# Patient Record
Sex: Male | Born: 1935 | Race: White | Hispanic: No | Smoking: Former smoker
Health system: Southern US, Community
[De-identification: ages and names within clinical notes are randomized; demographics above are authoritative.]

## PROBLEM LIST (undated history)

## (undated) DIAGNOSIS — I4891 Unspecified atrial fibrillation: Secondary | ICD-10-CM

## (undated) DIAGNOSIS — F0390 Unspecified dementia without behavioral disturbance: Secondary | ICD-10-CM

## (undated) DIAGNOSIS — R0602 Shortness of breath: Secondary | ICD-10-CM

## (undated) DIAGNOSIS — K219 Gastro-esophageal reflux disease without esophagitis: Secondary | ICD-10-CM

## (undated) DIAGNOSIS — J189 Pneumonia, unspecified organism: Secondary | ICD-10-CM

## (undated) DIAGNOSIS — I10 Essential (primary) hypertension: Secondary | ICD-10-CM

## (undated) HISTORY — PX: CATARACT EXTRACTION: SUR2

---

## 2011-06-13 ENCOUNTER — Ambulatory Visit (INDEPENDENT_AMBULATORY_CARE_PROVIDER_SITE_OTHER): Payer: Medicare Other | Admitting: Family Medicine

## 2011-06-13 DIAGNOSIS — R2681 Unsteadiness on feet: Secondary | ICD-10-CM | POA: Insufficient documentation

## 2011-06-13 DIAGNOSIS — I739 Peripheral vascular disease, unspecified: Secondary | ICD-10-CM | POA: Insufficient documentation

## 2011-06-13 DIAGNOSIS — R531 Weakness: Secondary | ICD-10-CM

## 2011-06-13 DIAGNOSIS — R251 Tremor, unspecified: Secondary | ICD-10-CM | POA: Insufficient documentation

## 2011-06-13 DIAGNOSIS — I10 Essential (primary) hypertension: Secondary | ICD-10-CM

## 2011-06-13 DIAGNOSIS — R32 Unspecified urinary incontinence: Secondary | ICD-10-CM

## 2011-06-13 DIAGNOSIS — R259 Unspecified abnormal involuntary movements: Secondary | ICD-10-CM

## 2011-06-13 DIAGNOSIS — R269 Unspecified abnormalities of gait and mobility: Secondary | ICD-10-CM

## 2011-06-13 LAB — POCT UA - MICROSCOPIC ONLY
Amorphous: POSITIVE
Bacteria, U Microscopic: NEGATIVE
Casts, Ur, LPF, POC: NEGATIVE
Crystals, Ur, HPF, POC: POSITIVE
Mucus, UA: POSITIVE
Yeast, UA: NEGATIVE

## 2011-06-13 LAB — POCT URINALYSIS DIPSTICK
Bilirubin, UA: NEGATIVE
Blood, UA: NEGATIVE
Glucose, UA: NEGATIVE
Ketones, UA: NEGATIVE
Leukocytes, UA: NEGATIVE
Nitrite, UA: NEGATIVE
Protein, UA: 30
Spec Grav, UA: 1.02
Urobilinogen, UA: 1
pH, UA: 7

## 2011-06-13 LAB — COMPREHENSIVE METABOLIC PANEL
ALT: 13 U/L (ref 0–53)
AST: 19 U/L (ref 0–37)
Albumin: 4.4 g/dL (ref 3.5–5.2)
Alkaline Phosphatase: 50 U/L (ref 39–117)
BUN: 22 mg/dL (ref 6–23)
CO2: 28 mEq/L (ref 19–32)
Calcium: 9.7 mg/dL (ref 8.4–10.5)
Chloride: 101 mEq/L (ref 96–112)
Creat: 1.09 mg/dL (ref 0.50–1.35)
Glucose, Bld: 99 mg/dL (ref 70–99)
Potassium: 4.1 mEq/L (ref 3.5–5.3)
Sodium: 136 mEq/L (ref 135–145)
Total Bilirubin: 0.6 mg/dL (ref 0.3–1.2)
Total Protein: 7 g/dL (ref 6.0–8.3)

## 2011-06-13 LAB — TSH: TSH: 0.892 u[IU]/mL (ref 0.350–4.500)

## 2011-06-13 MED ORDER — AMLODIPINE BESYLATE 5 MG PO TABS: 5.0000 mg | ORAL_TABLET | Freq: Every day | ORAL | Status: DC

## 2011-06-13 NOTE — Progress Notes (Signed)
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 He lives alone but is on oxygen on him frequently. His son, being a retired Emergency planning/management officer, does have the ability check his father every day.  Over the last year Mr. Supple has started to talk "crazy", mumbling at times. He is also quite dizzy when he stands and he's had several falls  Objective this elderly man is in no acute distress, mumbles almost unintelligibly. His son is at his side today stating in his wheelchair. When I tried to get Mr. Dunne to stand. It's obvious that he has a gross tremor, and he is unable to really stand straight. He takes quite a bit of effort to get him out of a chair.  HEENT: Unremarkable  Neck: No bruits, no thyromegaly, supple  Chest: Clear to auscultation  Heart: Regular, no murmur, no gallop or rub.  I helped Mr. Tillman up and inspected him for bruises, lacerations or other signs of, of which there are none.  Extremities: 2+ left pedal edema, left foot is cold but has reasonable color, capillary filling is about 10 seconds. Right foot appears normal.  Legs are nontender without significant edema or ecchymosis.  Results for orders placed in visit on 06/13/11  POCT UA - MICROSCOPIC ONLY      Component Value Range   WBC, Ur, HPF, POC 0-5     RBC, urine, microscopic 2-10     Bacteria, U Microscopic negative     Mucus, UA positive     Epithelial cells, urine per micros 0-5     Crystals, Ur, HPF, POC positive     Casts, Ur, LPF, POC negative     Yeast, UA negative     Amorphous positive    POCT URINALYSIS DIPSTICK      Component Value Range   Color, UA yellow     Clarity, UA clear     Glucose, UA negative     Bilirubin, UA negative     Ketones, UA negative     Spec Grav, UA 1.020     Blood, UA negative     pH, UA 7.0     Protein, UA 30     Urobilinogen, UA 1.0     Nitrite, UA negative     Leukocytes, UA Negative       Assessment: Clearly we are  facing some end-of-life issues. He subjective nursing home care with the son and he is committed to keeping his father at home as long as possible. He plans on moving in with him the next day or so.  Plan: Refer to neurology  Check other labs referable to unsteady gait  Encourage son to consider long-term issues such as nursing home and advanced directives  Treat hypertension with amlodipine 5 mg daily

## 2011-06-13 NOTE — Patient Instructions (Signed)
I am quite concerned with the debilitated condition of Ronald Prince.  He will be needing more and more assistance to maintain his activities of daily living:  Feeding self, clothing self, bathing and toileting.  I think serious consideration should be given to nursing home placement in the near future.  I am happy to provide medical care and evaluation as the needs arise.  I am running several tests to see if there is any reversible cause of the weakness and unsteady gait, as well as the urinary incontinence.  I am referring Ronald Prince to neurology.    Advance Directives (My Voice, My Choice) Advance directives are a means for you to make choices about your health care. It is a way that you may accept or refuse medical treatment if you cannot speak for yourself. An advance directive gives you a way to express your wishes about treatment choices in the event that you cannot speak for yourself. These directives protect your right to make your own health care choices. Some examples of advance directives would be:  A living will is a prepared document that designates your wishes in the event of a serious illness when you cannot care for yourself.   A patient advocate designation for health care means you choose someone who knows your wishes and can speak for you, on your behalf, should you not be able to do so yourself. This is often a close friend or family member.   Think about what is important for you in your life. To what extent do you want machines to keep you alive? How much pain are you willing to accept?   Decide what types of life-sustaining treatments you would or would not want.   Name a person to be your advocate who understands all your wishes and is willing and able to carry them out.   A durable power of attorney for health care is a formal legal agreement with an attorney or legal representative who will be bound to carry out your wishes in the event you are unable to care for or represent  yourself. This should be someone you trust to make important medical decisions for you.   Do Not Resuscitate (DNR) is a request to do nothing in the event that your heart stops. A DNR order is used if you are very ill and not expected to recover. DNR orders are accepted by nearly all caregivers and hospitals.  Most caregiver's offices and hospitals have advance directive forms you can use. You may cancel or change these documents at any time. You must be mentally sound and able to communicate your wishes at the time you fill out these forms. Regardless of how you let your final wishes be known in the event of a terminal illness, make sure you discuss them with your family and friends. Copies should be given to your caregiver, your hospital, your advocate or attorney, and to significant others. If you travel, you may want to find out what is legal and binding in the states where you will be. Laws vary from state to state. Document Released: 03/21/2001 Document Revised: 12/30/2010 Document Reviewed: 09/18/2007 [facility] Patient Information 2012 [place], [place].

## 2011-06-14 LAB — CBC
HCT: 35 % — ABNORMAL LOW (ref 39.0–52.0)
Hemoglobin: 12.1 g/dL — ABNORMAL LOW (ref 13.0–17.0)
MCH: 33.1 pg (ref 26.0–34.0)
MCHC: 34.6 g/dL (ref 30.0–36.0)
MCV: 95.6 fL (ref 78.0–100.0)
Platelets: 298 10*3/uL (ref 150–400)
RBC: 3.66 MIL/uL — ABNORMAL LOW (ref 4.22–5.81)
RDW: 13.2 % (ref 11.5–15.5)
WBC: 7.6 10*3/uL (ref 4.0–10.5)

## 2011-06-15 ENCOUNTER — Telehealth: Payer: Self-pay

## 2011-06-15 LAB — URINE CULTURE: Colony Count: 15000

## 2011-06-15 NOTE — Telephone Encounter (Signed)
Pt's son CB about pt's labs, gave him Dr Cain Saupe message - see notes on lab results. Son agreed to have pt f/up on possible prostatitis in 2 wks. Transferred to 104 to try to sch appt.

## 2011-08-04 ENCOUNTER — Encounter: Payer: Self-pay | Admitting: Family Medicine

## 2011-08-04 ENCOUNTER — Ambulatory Visit (INDEPENDENT_AMBULATORY_CARE_PROVIDER_SITE_OTHER): Payer: Medicare Other | Admitting: Family Medicine

## 2011-08-04 VITALS — BP 163/79 | HR 66 | Temp 98.1°F | Resp 18 | Ht 69.0 in | Wt 152.0 lb

## 2011-08-04 DIAGNOSIS — Z23 Encounter for immunization: Secondary | ICD-10-CM

## 2011-08-04 DIAGNOSIS — M79609 Pain in unspecified limb: Secondary | ICD-10-CM

## 2011-08-04 DIAGNOSIS — T148XXA Other injury of unspecified body region, initial encounter: Secondary | ICD-10-CM

## 2011-08-04 DIAGNOSIS — IMO0002 Reserved for concepts with insufficient information to code with codable children: Secondary | ICD-10-CM

## 2011-08-04 MED ORDER — TETANUS-DIPHTH-ACELL PERTUSSIS 5-2.5-18.5 LF-MCG/0.5 IM SUSP
0.5000 mL | Freq: Once | INTRAMUSCULAR | Status: AC
  Administered 2011-08-04: 0.5 mL via INTRAMUSCULAR

## 2011-08-04 NOTE — Progress Notes (Signed)
78 yo man lost his balance and fell into fire place scraping the right forearm over the ulna.  No other injuries or LOC  O: NAD;  Seen with son Walking well with walker on wheels Slurred speech but appropriate and friendly   12 cm partial thickness laceration:  steristripped  I spent 30 minutes applying benzoin and the steristrips as well as a sterile dressing  A:  Extensive laceration, partial thickness, without other injuries  P:  Tdap, recheck prn

## 2012-05-27 ENCOUNTER — Other Ambulatory Visit: Payer: Self-pay | Admitting: Family Medicine

## 2012-07-05 ENCOUNTER — Other Ambulatory Visit: Payer: Self-pay | Admitting: Physician Assistant

## 2012-07-26 ENCOUNTER — Ambulatory Visit (INDEPENDENT_AMBULATORY_CARE_PROVIDER_SITE_OTHER): Payer: Medicare Other | Admitting: Family Medicine

## 2012-07-26 ENCOUNTER — Encounter: Payer: Self-pay | Admitting: Family Medicine

## 2012-07-26 VITALS — BP 110/67 | HR 75 | Temp 98.2°F | Resp 18 | Ht 70.0 in | Wt 150.0 lb

## 2012-07-26 DIAGNOSIS — I10 Essential (primary) hypertension: Secondary | ICD-10-CM

## 2012-07-26 DIAGNOSIS — D509 Iron deficiency anemia, unspecified: Secondary | ICD-10-CM

## 2012-07-26 DIAGNOSIS — E611 Iron deficiency: Secondary | ICD-10-CM

## 2012-07-26 MED ORDER — AMLODIPINE BESYLATE 5 MG PO TABS: ORAL_TABLET | ORAL | Status: DC

## 2012-07-26 NOTE — Addendum Note (Signed)
Addended by: Elvina Sidle on: 07/26/2012 11:20 AM   Modules accepted: Orders

## 2012-07-26 NOTE — Addendum Note (Signed)
Addended by: Mervin Kung on: 07/26/2012 11:31 AM   Modules accepted: Orders

## 2012-07-26 NOTE — Progress Notes (Addendum)
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  He is cared for by son and son's wife who live off [address]. Toilets without problem.  Uses walker at home.  Feeds self but needs help dressing.  Occasionally gets headache for which tylenol works.  Objective:  NAD Patient mumbles, but responds appropriately.  Tends to ramble on about his daily experience. Chest:  Clear Heart:  Regular, no murmur Abdomen:  nontender Ext: trace edema  Assessment:  Controlled hypertension.  Hypertension - Plan: amLODipine (NORVASC) 5 MG tablet  Low iron - Plan: POCT CBC, Ferritin  Signed, Elvina Sidle, MD   Signed, Elvina Sidle, MD

## 2012-07-26 NOTE — Patient Instructions (Addendum)
Hypertension As your heart beats, it forces blood through your arteries. This force is your blood pressure. If the pressure is too high, it is called hypertension (HTN) or high blood pressure. HTN is dangerous because you may have it and not know it. High blood pressure may mean that your heart has to work harder to pump blood. Your arteries may be narrow or stiff. The extra work puts you at risk for heart disease, stroke, and other problems.  Blood pressure consists of two numbers, a higher number over a lower, 110/72, for example. It is stated as "110 over 72." The ideal is below 120 for the top number (systolic) and under 80 for the bottom (diastolic). Write down your blood pressure today. You should pay close attention to your blood pressure if you have certain conditions such as:  Heart failure.  Prior heart attack.  Diabetes  Chronic kidney disease.  Prior stroke.  Multiple risk factors for heart disease. To see if you have HTN, your blood pressure should be measured while you are seated with your arm held at the level of the heart. It should be measured at least twice. A one-time elevated blood pressure reading (especially in the Emergency Department) does not mean that you need treatment. There may be conditions in which the blood pressure is different between your right and left arms. It is important to see your caregiver soon for a recheck. Most people have essential hypertension which means that there is not a specific cause. This type of high blood pressure may be lowered by changing lifestyle factors such as:  Stress.  Smoking.  Lack of exercise.  Excessive weight.  Drug/tobacco/alcohol use.  Eating less salt. Most people do not have symptoms from high blood pressure until it has caused damage to the body. Effective treatment can often prevent, delay or reduce that damage. TREATMENT  When a cause has been identified, treatment for high blood pressure is directed at the  cause. There are a large number of medications to treat HTN. These fall into several categories, and your caregiver will help you select the medicines that are best for you. Medications may have side effects. You should review side effects with your caregiver. If your blood pressure stays high after you have made lifestyle changes or started on medicines,   Your medication(s) may need to be changed.  Other problems may need to be addressed.  Be certain you understand your prescriptions, and know how and when to take your medicine.  Be sure to follow up with your caregiver within the time frame advised (usually within two weeks) to have your blood pressure rechecked and to review your medications.  If you are taking more than one medicine to lower your blood pressure, make sure you know how and at what times they should be taken. Taking two medicines at the same time can result in blood pressure that is too low. SEEK IMMEDIATE MEDICAL CARE IF:  You develop a severe headache, blurred or changing vision, or confusion.  You have unusual weakness or numbness, or a faint feeling.  You have severe chest or abdominal pain, vomiting, or breathing problems. MAKE SURE YOU:   Understand these instructions.  Will watch your condition.  Will get help right away if you are not doing well or get worse. Document Released: 01/10/2005 Document Revised: 04/04/2011 Document Reviewed: 08/31/2007 [facility] Patient Information 2014 [place], [place]. Advance Directives (My Voice, My Choice) Advance directives are a means for you to make choices  about your health care. It is a way that you may accept or refuse medical treatment if you cannot speak for yourself. An advance directive gives you a way to express your wishes about treatment choices in the event that you cannot speak for yourself. These directives protect your right to make your own health care choices. Some examples of advance directives would be:  A  living will is a prepared document that designates your wishes in the event of a serious illness when you cannot care for yourself.  A patient advocate designation for health care means you choose someone who knows your wishes and can speak for you, on your behalf, should you not be able to do so yourself. This is often a close friend or family member.  Think about what is important for you in your life. To what extent do you want machines to keep you alive? How much pain are you willing to accept?  Decide what types of life-sustaining treatments you would or would not want.  Name a person to be your advocate who understands all your wishes and is willing and able to carry them out.  A durable power of attorney for health care is a formal legal agreement with an attorney or legal representative who will be bound to carry out your wishes in the event you are unable to care for or represent yourself. This should be someone you trust to make important medical decisions for you.  Do Not Resuscitate (DNR) is a request to do nothing in the event that your heart stops. A DNR order is used if you are very ill and not expected to recover. DNR orders are accepted by nearly all caregivers and hospitals. Most caregiver's offices and hospitals have advance directive forms you can use. You may cancel or change these documents at any time. You must be mentally sound and able to communicate your wishes at the time you fill out these forms. Regardless of how you let your final wishes be known in the event of a terminal illness, make sure you discuss them with your family and friends. Copies should be given to your caregiver, your hospital, your advocate or attorney, and to significant others. If you travel, you may want to find out what is legal and binding in the states where you will be. Laws vary from state to state. Document Released: 03/21/2001 Document Revised: 04/04/2011 Document Reviewed: 09/18/2007 [facility]  Patient Information 2014 [place], [place].

## 2012-07-27 LAB — FERRITIN: Ferritin: 200 ng/mL (ref 22–322)

## 2012-07-27 LAB — CBC WITH DIFFERENTIAL/PLATELET
Basophils Absolute: 0 10*3/uL (ref 0.0–0.1)
Basophils Relative: 0 % (ref 0–1)
Eosinophils Absolute: 0.1 10*3/uL (ref 0.0–0.7)
Eosinophils Relative: 1 % (ref 0–5)
HCT: 31.5 % — ABNORMAL LOW (ref 39.0–52.0)
Hemoglobin: 11 g/dL — ABNORMAL LOW (ref 13.0–17.0)
Lymphocytes Relative: 17 % (ref 12–46)
Lymphs Abs: 1.2 10*3/uL (ref 0.7–4.0)
MCH: 32.1 pg (ref 26.0–34.0)
MCHC: 34.9 g/dL (ref 30.0–36.0)
MCV: 91.8 fL (ref 78.0–100.0)
Monocytes Absolute: 0.5 10*3/uL (ref 0.1–1.0)
Monocytes Relative: 7 % (ref 3–12)
Neutro Abs: 5.5 10*3/uL (ref 1.7–7.7)
Neutrophils Relative %: 75 % (ref 43–77)
Platelets: 247 10*3/uL (ref 150–400)
RBC: 3.43 MIL/uL — ABNORMAL LOW (ref 4.22–5.81)
RDW: 13.6 % (ref 11.5–15.5)
WBC: 7.3 10*3/uL (ref 4.0–10.5)

## 2012-10-31 ENCOUNTER — Ambulatory Visit (INDEPENDENT_AMBULATORY_CARE_PROVIDER_SITE_OTHER): Payer: Medicare Other | Admitting: Radiology

## 2012-10-31 DIAGNOSIS — Z23 Encounter for immunization: Secondary | ICD-10-CM

## 2013-04-24 DIAGNOSIS — J189 Pneumonia, unspecified organism: Secondary | ICD-10-CM

## 2013-04-24 HISTORY — DX: Pneumonia, unspecified organism: J18.9

## 2013-04-30 ENCOUNTER — Inpatient Hospital Stay (HOSPITAL_COMMUNITY)
Admission: EM | Admit: 2013-04-30 | Discharge: 2013-05-10 | DRG: 871 | Disposition: A | Discharge status: Skilled Nursing Facility | Payer: Medicare Other | Attending: Internal Medicine | Admitting: Internal Medicine

## 2013-04-30 ENCOUNTER — Emergency Department (HOSPITAL_COMMUNITY): Payer: Medicare Other

## 2013-04-30 ENCOUNTER — Encounter (HOSPITAL_COMMUNITY): Payer: Self-pay | Admitting: Emergency Medicine

## 2013-04-30 DIAGNOSIS — E876 Hypokalemia: Secondary | ICD-10-CM

## 2013-04-30 DIAGNOSIS — A419 Sepsis, unspecified organism: Secondary | ICD-10-CM

## 2013-04-30 DIAGNOSIS — J9600 Acute respiratory failure, unspecified whether with hypoxia or hypercapnia: Secondary | ICD-10-CM

## 2013-04-30 DIAGNOSIS — K219 Gastro-esophageal reflux disease without esophagitis: Secondary | ICD-10-CM | POA: Diagnosis present

## 2013-04-30 DIAGNOSIS — Z515 Encounter for palliative care: Secondary | ICD-10-CM

## 2013-04-30 DIAGNOSIS — Z880 Allergy status to penicillin: Secondary | ICD-10-CM

## 2013-04-30 DIAGNOSIS — R2681 Unsteadiness on feet: Secondary | ICD-10-CM

## 2013-04-30 DIAGNOSIS — G929 Unspecified toxic encephalopathy: Secondary | ICD-10-CM

## 2013-04-30 DIAGNOSIS — R251 Tremor, unspecified: Secondary | ICD-10-CM

## 2013-04-30 DIAGNOSIS — R6520 Severe sepsis without septic shock: Secondary | ICD-10-CM

## 2013-04-30 DIAGNOSIS — Z87891 Personal history of nicotine dependence: Secondary | ICD-10-CM

## 2013-04-30 DIAGNOSIS — R269 Unspecified abnormalities of gait and mobility: Secondary | ICD-10-CM

## 2013-04-30 DIAGNOSIS — I4891 Unspecified atrial fibrillation: Secondary | ICD-10-CM | POA: Diagnosis present

## 2013-04-30 DIAGNOSIS — IMO0002 Reserved for concepts with insufficient information to code with codable children: Secondary | ICD-10-CM

## 2013-04-30 DIAGNOSIS — G92 Toxic encephalopathy: Secondary | ICD-10-CM | POA: Diagnosis present

## 2013-04-30 DIAGNOSIS — N179 Acute kidney failure, unspecified: Secondary | ICD-10-CM

## 2013-04-30 DIAGNOSIS — K668 Other specified disorders of peritoneum: Secondary | ICD-10-CM

## 2013-04-30 DIAGNOSIS — J189 Pneumonia, unspecified organism: Secondary | ICD-10-CM

## 2013-04-30 DIAGNOSIS — E43 Unspecified severe protein-calorie malnutrition: Secondary | ICD-10-CM

## 2013-04-30 DIAGNOSIS — I739 Peripheral vascular disease, unspecified: Secondary | ICD-10-CM

## 2013-04-30 DIAGNOSIS — D649 Anemia, unspecified: Secondary | ICD-10-CM

## 2013-04-30 DIAGNOSIS — J690 Pneumonitis due to inhalation of food and vomit: Secondary | ICD-10-CM | POA: Diagnosis present

## 2013-04-30 DIAGNOSIS — N2889 Other specified disorders of kidney and ureter: Secondary | ICD-10-CM

## 2013-04-30 DIAGNOSIS — D509 Iron deficiency anemia, unspecified: Secondary | ICD-10-CM | POA: Diagnosis present

## 2013-04-30 DIAGNOSIS — F0390 Unspecified dementia without behavioral disturbance: Secondary | ICD-10-CM | POA: Diagnosis present

## 2013-04-30 DIAGNOSIS — R1311 Dysphagia, oral phase: Secondary | ICD-10-CM | POA: Diagnosis present

## 2013-04-30 DIAGNOSIS — Z66 Do not resuscitate: Secondary | ICD-10-CM | POA: Diagnosis present

## 2013-04-30 DIAGNOSIS — R1319 Other dysphagia: Secondary | ICD-10-CM

## 2013-04-30 DIAGNOSIS — I10 Essential (primary) hypertension: Secondary | ICD-10-CM | POA: Diagnosis present

## 2013-04-30 HISTORY — DX: Shortness of breath: R06.02

## 2013-04-30 HISTORY — DX: Unspecified atrial fibrillation: I48.91

## 2013-04-30 HISTORY — DX: Essential (primary) hypertension: I10

## 2013-04-30 HISTORY — DX: Unspecified dementia, unspecified severity, without behavioral disturbance, psychotic disturbance, mood disturbance, and anxiety: F03.90

## 2013-04-30 HISTORY — DX: Pneumonia, unspecified organism: J18.9

## 2013-04-30 HISTORY — DX: Gastro-esophageal reflux disease without esophagitis: K21.9

## 2013-04-30 LAB — CBC WITH DIFFERENTIAL/PLATELET
Basophils Absolute: 0 10*3/uL (ref 0.0–0.1)
Basophils Relative: 0 % (ref 0–1)
Eosinophils Absolute: 0 10*3/uL (ref 0.0–0.7)
Eosinophils Relative: 0 % (ref 0–5)
HCT: 38 % — ABNORMAL LOW (ref 39.0–52.0)
Hemoglobin: 12.6 g/dL — ABNORMAL LOW (ref 13.0–17.0)
Lymphocytes Relative: 7 % — ABNORMAL LOW (ref 12–46)
Lymphs Abs: 1 10*3/uL (ref 0.7–4.0)
MCH: 33.6 pg (ref 26.0–34.0)
MCHC: 33.2 g/dL (ref 30.0–36.0)
MCV: 101.3 fL — ABNORMAL HIGH (ref 78.0–100.0)
Monocytes Absolute: 0.2 10*3/uL (ref 0.1–1.0)
Monocytes Relative: 2 % — ABNORMAL LOW (ref 3–12)
Neutro Abs: 12 10*3/uL — ABNORMAL HIGH (ref 1.7–7.7)
Neutrophils Relative %: 91 % — ABNORMAL HIGH (ref 43–77)
Platelets: 327 10*3/uL (ref 150–400)
RBC: 3.75 MIL/uL — ABNORMAL LOW (ref 4.22–5.81)
RDW: 13.6 % (ref 11.5–15.5)
WBC: 13.2 10*3/uL — ABNORMAL HIGH (ref 4.0–10.5)

## 2013-04-30 LAB — URINALYSIS, ROUTINE W REFLEX MICROSCOPIC
Bilirubin Urine: NEGATIVE
Glucose, UA: NEGATIVE mg/dL
Hgb urine dipstick: NEGATIVE
Ketones, ur: 15 mg/dL — AB
Nitrite: NEGATIVE
Protein, ur: 30 mg/dL — AB
Specific Gravity, Urine: 1.027 (ref 1.005–1.030)
Urobilinogen, UA: 0.2 mg/dL (ref 0.0–1.0)
pH: 5 (ref 5.0–8.0)

## 2013-04-30 LAB — BASIC METABOLIC PANEL
BUN: 27 mg/dL — ABNORMAL HIGH (ref 6–23)
CO2: 20 mEq/L (ref 19–32)
Calcium: 9.1 mg/dL (ref 8.4–10.5)
Chloride: 100 mEq/L (ref 96–112)
Creatinine, Ser: 1.38 mg/dL — ABNORMAL HIGH (ref 0.50–1.35)
GFR calc Af Amer: 49 mL/min — ABNORMAL LOW (ref >90)
GFR calc non Af Amer: 42 mL/min — ABNORMAL LOW (ref >90)
Glucose, Bld: 204 mg/dL — ABNORMAL HIGH (ref 70–99)
Potassium: 3.6 mEq/L — ABNORMAL LOW (ref 3.7–5.3)
Sodium: 145 mEq/L (ref 137–147)

## 2013-04-30 LAB — ABO/RH: ABO/RH(D): A POS

## 2013-04-30 LAB — URINE MICROSCOPIC-ADD ON

## 2013-04-30 LAB — I-STAT ARTERIAL BLOOD GAS, ED
Acid-base deficit: 10 mmol/L — ABNORMAL HIGH (ref 0.0–2.0)
Bicarbonate: 16.1 mEq/L — ABNORMAL LOW (ref 20.0–24.0)
O2 Saturation: 99 %
TCO2: 17 mmol/L (ref 0–100)
pCO2 arterial: 34.1 mmHg — ABNORMAL LOW (ref 35.0–45.0)
pH, Arterial: 7.283 — ABNORMAL LOW (ref 7.350–7.450)
pO2, Arterial: 156 mmHg — ABNORMAL HIGH (ref 80.0–100.0)

## 2013-04-30 LAB — PROTIME-INR
INR: 1.16 (ref 0.00–1.49)
Prothrombin Time: 14.6 seconds (ref 11.6–15.2)

## 2013-04-30 LAB — TYPE AND SCREEN
ABO/RH(D): A POS
Antibody Screen: NEGATIVE

## 2013-04-30 LAB — LACTIC ACID, PLASMA: Lactic Acid, Venous: 10.6 mmol/L — ABNORMAL HIGH (ref 0.5–2.2)

## 2013-04-30 LAB — MRSA PCR SCREENING: MRSA BY PCR: NEGATIVE

## 2013-04-30 LAB — TROPONIN I: Troponin I: <0.3 ng/mL (ref <0.30)

## 2013-04-30 LAB — PRO B NATRIURETIC PEPTIDE: Pro B Natriuretic peptide (BNP): 523.6 pg/mL — ABNORMAL HIGH (ref 0–450)

## 2013-04-30 MED ORDER — SODIUM CHLORIDE 0.9 % IJ SOLN
3.0000 mL | Freq: Two times a day (BID) | INTRAMUSCULAR | Status: DC
  Administered 2013-04-30 – 2013-05-04 (×3): 3 mL via INTRAVENOUS

## 2013-04-30 MED ORDER — ONDANSETRON HCL 4 MG/2ML IJ SOLN: 4.0000 mg | Freq: Four times a day (QID) | INTRAMUSCULAR | Status: DC | PRN

## 2013-04-30 MED ORDER — ASPIRIN EC 81 MG PO TBEC
81.0000 mg | DELAYED_RELEASE_TABLET | Freq: Every day | ORAL | Status: DC
  Filled 2013-04-30 (×2): qty 1

## 2013-04-30 MED ORDER — SODIUM CHLORIDE 0.9 % IV BOLUS (SEPSIS)
500.0000 mL | Freq: Once | INTRAVENOUS | Status: AC
  Administered 2013-04-30: 500 mL via INTRAVENOUS

## 2013-04-30 MED ORDER — METRONIDAZOLE IN NACL 5-0.79 MG/ML-% IV SOLN
500.0000 mg | Freq: Once | INTRAVENOUS | Status: AC
Start: 2013-04-30 — End: 2013-04-30
  Administered 2013-04-30: 500 mg via INTRAVENOUS
  Filled 2013-04-30: qty 100

## 2013-04-30 MED ORDER — DEXTROSE 5 % IV SOLN
1.0000 g | Freq: Two times a day (BID) | INTRAVENOUS | Status: DC
  Administered 2013-04-30: 1 g via INTRAVENOUS
  Filled 2013-04-30 (×2): qty 1

## 2013-04-30 MED ORDER — POLYETHYLENE GLYCOL 3350 17 G PO PACK
17.0000 g | PACK | Freq: Every day | ORAL | Status: DC | PRN
  Filled 2013-04-30: qty 1

## 2013-04-30 MED ORDER — SODIUM CHLORIDE 0.9 % IV BOLUS (SEPSIS)
1000.0000 mL | Freq: Once | INTRAVENOUS | Status: DC
Start: 2013-04-30 — End: 2013-04-30

## 2013-04-30 MED ORDER — ONDANSETRON HCL 4 MG/2ML IJ SOLN
4.0000 mg | Freq: Once | INTRAMUSCULAR | Status: AC
  Administered 2013-04-30: 4 mg via INTRAVENOUS
  Filled 2013-04-30: qty 2

## 2013-04-30 MED ORDER — SODIUM CHLORIDE 0.9 % IV SOLN: INTRAVENOUS | Status: AC

## 2013-04-30 MED ORDER — DEXTROSE 5 % IV SOLN
500.0000 mg | INTRAVENOUS | Status: AC
  Administered 2013-04-30 – 2013-05-06 (×7): 500 mg via INTRAVENOUS
  Filled 2013-04-30 (×8): qty 500

## 2013-04-30 MED ORDER — VANCOMYCIN HCL IN DEXTROSE 1-5 GM/200ML-% IV SOLN
1000.0000 mg | Freq: Once | INTRAVENOUS | Status: AC
  Administered 2013-04-30: 1000 mg via INTRAVENOUS
  Filled 2013-04-30: qty 200

## 2013-04-30 MED ORDER — ONDANSETRON HCL 4 MG PO TABS: 4.0000 mg | ORAL_TABLET | Freq: Four times a day (QID) | ORAL | Status: DC | PRN

## 2013-04-30 MED ORDER — HEPARIN SODIUM (PORCINE) 5000 UNIT/ML IJ SOLN
5000.0000 [IU] | Freq: Three times a day (TID) | INTRAMUSCULAR | Status: DC
  Administered 2013-04-30 – 2013-05-05 (×15): 5000 [IU] via SUBCUTANEOUS
  Filled 2013-04-30 (×19): qty 1

## 2013-04-30 MED ORDER — DEXTROSE 5 % IV SOLN
1.0000 g | INTRAVENOUS | Status: AC
  Administered 2013-04-30 – 2013-05-06 (×7): 1 g via INTRAVENOUS
  Filled 2013-04-30 (×8): qty 10

## 2013-04-30 MED ORDER — ACETAMINOPHEN 650 MG RE SUPP: 650.0000 mg | Freq: Four times a day (QID) | RECTAL | Status: DC | PRN

## 2013-04-30 MED ORDER — SODIUM CHLORIDE 0.9 % IV SOLN
INTRAVENOUS | Status: DC
  Administered 2013-05-01: 100 mL/h via INTRAVENOUS
  Administered 2013-05-02: 999 mL via INTRAVENOUS
  Administered 2013-05-02: 50 mL/h via INTRAVENOUS

## 2013-04-30 MED ORDER — ACETAMINOPHEN 325 MG PO TABS
650.0000 mg | ORAL_TABLET | Freq: Four times a day (QID) | ORAL | Status: DC | PRN
Start: 2013-04-30 — End: 2013-05-10
  Administered 2013-05-05 – 2013-05-10 (×2): 650 mg via ORAL
  Filled 2013-04-30 (×2): qty 2

## 2013-04-30 NOTE — ED Notes (Signed)
Attempt at in and out unsuccessful.

## 2013-04-30 NOTE — H&P (Signed)
[facility] History and Physical  Ronald Prince WUJ:[id number] DOB: 1917/10/18 DOA: 04/30/2013  Referring physician: Dr. Harl Bowie PCP: No primary provider on file.   Chief Complaint: confusion  HPI: Ronald Prince is a 80 y.o. male  80 year old with past medical history of dementia and atrial fibrillation he lives with his son and most of the history was obtained by him as the patient is confused. He started on the day prior to admission before he went to bed that he started vomiting. His morning when his son got up to the bathroom and found him on the ground unable to answer questions. His son relates is usually uses a walker but he couldn't walk. Been confused most of the day no fever chills or cough.  In the ED: Generally of normal saline and he is more responsive but not making any sense. An ABG was done that showed a pH of 7.28 PCO2 of 34 PO2 of 156 on BiPAP. Metabolic panel was done that shows no acute kidney injury I think acid was 10.6 a CBC shows a white count of 13 with a left shift chest x-ray shows right lower lobe infiltrates we're consulted for further evaluation.  Review of Systems:  Constitutional:  No weight loss, night sweats, Fevers, chills, fatigue.  HEENT:  No headaches, Difficulty swallowing,Tooth/dental problems,Sore throat,  No sneezing, itching, ear ache, nasal congestion, post nasal drip,  Cardio-vascular:  No chest pain, Orthopnea, PND, swelling in lower extremities, anasarca, dizziness, palpitations  GI:  No heartburn, indigestion, abdominal pain, nausea, vomiting, diarrhea, change in bowel habits, loss of appetite  Skin:  no rash or lesions.  GU:  no dysuria, change in color of urine, no urgency or frequency. No flank pain.  Musculoskeletal:  No joint pain or swelling. No decreased range of motion. No back pain.  Psych:  No change in mood or affect. No depression or anxiety. No memory loss.   Past Medical History  Diagnosis Date  . Atrial fibrillation    . Dementia    No past surgical history on file. Social History:  reports that he quit smoking about 26 years ago. He does not have any smokeless tobacco history on file. His alcohol and drug histories are not on file.  Allergies  Allergen Reactions  . Penicillins     No family history on file.   Prior to Admission medications   Medication Sig Start Date End Date Taking? Authorizing Provider  amLODipine (NORVASC) 5 MG tablet Take 5 mg by mouth daily.   Yes Historical Provider, MD  aspirin 81 MG tablet Take 81 mg by mouth daily.   Yes Historical Provider, MD  Ferrous Sulfate Dried (SLOW RELEASE IRON) 45 MG TBCR Take 40 mg by mouth daily.   Yes Historical Provider, MD  fish oil-omega-3 fatty acids 1000 MG capsule Take 1 g by mouth daily.    Yes Historical Provider, MD  Multiple Vitamin (MULTIVITAMIN) tablet Take 1 tablet by mouth daily.   Yes Historical Provider, MD   Physical Exam: Filed Vitals:   04/30/13 1100  BP: 120/56  Pulse: 96  Temp:   Resp: 16    BP 120/56  Pulse 96  Temp(Src) 98.5 F (36.9 C) (Rectal)  Resp 16  SpO2 100%  General:  Appears calm and comfortable, cachectic appearing Eyes: PERRL, normal lids, irises & conjunctiva ENT: grossly normal hearing, lips & tongue Neck: no LAD, masses or thyromegaly Cardiovascular: RRR, no m/r/g. No LE edema. Telemetry: SR, no arrhythmias  Respiratory: Moderate air  movement with crackles bilaterally. Abdomen: soft, ntnd Skin: no rash or induration seen on limited exam Musculoskeletal: grossly normal tone BUE/BLE Neurologic: grossly non-focal.          Labs on Admission:  Basic Metabolic Panel:  Recent Labs Lab 04/30/13 0800  NA 145  K 3.6*  CL 100  CO2 20  GLUCOSE 204*  BUN 27*  CREATININE 1.38*  CALCIUM 9.1   Liver Function Tests: No results found for this basename: AST, ALT, ALKPHOS, BILITOT, PROT, ALBUMIN,  in the last 168 hours No results found for this basename: LIPASE, AMYLASE,  in the last 168  hours No results found for this basename: AMMONIA,  in the last 168 hours CBC:  Recent Labs Lab 04/30/13 0800  WBC 13.2*  NEUTROABS 12.0*  HGB 12.6*  HCT 38.0*  MCV 101.3*  PLT 327   Cardiac Enzymes:  Recent Labs Lab 04/30/13 0800  TROPONINI <0.30    BNP (last 3 results)  Recent Labs  04/30/13 0800  PROBNP 523.6*   CBG: No results found for this basename: GLUCAP,  in the last 168 hours  Radiological Exams on Admission: Ct Abdomen Pelvis Wo Contrast  04/30/2013   ADDENDUM REPORT: 04/30/2013 10:12  ADDENDUM: There is no CT evidence of pneumoperitoneum.  Chiliaditic anatomy appreciated.   Electronically Signed   By: Salome HolmesHector  Cooper M.D.   On: 04/30/2013 10:12   04/30/2013   CLINICAL DATA:  Shortness breath, history of recent fall  EXAM: CT ABDOMEN AND PELVIS WITHOUT CONTRAST  TECHNIQUE: Multidetector CT imaging of the abdomen and pelvis was performed following the standard protocol without intravenous contrast.  COMPARISON:  None.  FINDINGS: Areas of increased density are appreciated within the lung bases left greater than right. Areas of consolidation appreciated on the left.  The stomach is moderately dilated and fluid-filled.  Noncontrast evaluation of the liver, spleen, adrenals, pancreas is unremarkable.  Portions of the proximal and transverse colon are interposed between the liver and the anterior abdominal wall.  Small low attenuating exophytic nodules are appreciated involving the right and left kidneys. The largest is on the left measuring 1.5 cm in diameter. Hounsfield units of these regions are consistent with fluid and these areas likely represent cysts. Further evaluation with renal ultrasound for further characterization is recommended. Noncontrast evaluation kidney is otherwise unremarkable.  A mild-to-moderate amount of stool is appreciated within the colon. Multiple fluid-filled loops of nondilated small bowel are appreciated primarily within the left side of the  abdomen and pelvis. Otherwise no evidence of bowel obstruction. The appendix is identified and is unremarkable.  Within the limitations of a noncontrast CT no abdominal or pelvic masses, free fluid, loculated fluid collections, nor adenopathy.  No abdominal aortic aneurysm. Scattered atherosclerotic calcifications appreciated.  Degenerative changes are appreciated within the hips and lumbar spine, without aggressive appearing osseous lesions.  No abdominal wall hernia appreciated. A small fat containing inguinal hernia on the left.  IMPRESSION: 1. Dilated fluid-filled stomach and multiple fluid-filled loops of small bowel. The bowel pattern presently is nonobstructive with no an evolving ileus cannot be excluded. Surveillance evaluation with plain film abdominal radiograph recommended. 2. Indeterminate nodules in the kidneys these findings may possibly be further characterized with ultrasound 3. Findings concerning for left lower lobe infiltrate. Component of atelectasis within the lung bases also a diagnostic consideration. 4. Small fat containing inguinal hernia on the left without further focal or acute abnormalities.  Electronically Signed: By: Salome HolmesHector  Cooper M.D. On: 04/30/2013 09:17  Ct Head Wo Contrast  04/30/2013   CLINICAL DATA:  Found on floor  EXAM: CT HEAD WITHOUT CONTRAST  TECHNIQUE: Contiguous axial images were obtained from the base of the skull through the vertex without intravenous contrast.  COMPARISON:  None.  FINDINGS: Generalized atrophy. Microvascular ischemic changes are present bilaterally which appear chronic.  Negative for acute infarct. Negative for hemorrhage mass or skull fracture.  Air-fluid levels in the maxillary sinus bilaterally. Mild atherosclerotic calcification in the cavernous carotid.  IMPRESSION: Atrophy and chronic microvascular ischemia.  No acute abnormality.   Electronically Signed   By: Marlan Palau M.D.   On: 04/30/2013 09:05   Dg Chest Portable 1 View  04/30/2013    CLINICAL DATA:  Shortness of breath.  Former smoker.  Dementia.  EXAM: PORTABLE CHEST - 1 VIEW  COMPARISON:  None.  FINDINGS: Pneumoperitoneum localizing beneath the right hemidiaphragm. Cardiac silhouette normal in size. Thoracic aorta mildly atherosclerotic. Hilar and mediastinal contours otherwise unremarkable. Airspace consolidation in the retrocardiac left lung base. Lungs otherwise clear. Pulmonary vascularity normal. No pneumothorax. No pleural effusions.  IMPRESSION: 1. Pneumoperitoneum. 2. Left lower lobe pneumonia. Critical v patent the alue/emergent results were called by telephone at the time of interpretation on 04/30/2013 at 7:57 AM to Dr. Raeford Razor, who verbally acknowledged these results.   Electronically Signed   By: Hulan Saas M.D.   On: 04/30/2013 08:02    EKG: Independently reviewed. Sinus tach, non specific st changes  Assessment/Plan Respiratory failure, acute/  CAP (community acquired pneumonia)/  Severe sepsis: - In the ED his oxygen saturation was 75% on room air, he was started on BiPAP and it went up to 96%, an ABG was done with results as above. I agree with starting him on Rocephin and azithromycin, and he got 1 L of normal saline in the ED. With improvement in his blood pressure initially when he came in he was running 89/54,000 100s. Continue IV fluids place a Foley monitor strict I.'s and nose. - Check lactic acid in the morning. - Get blood cultures and sputum cultures. - I have a long discussion with the family about his poor prognosis, they understand.  they do no want to escalate care they will like to treat him for a couple of day and see how he does if there is no improvement after a few days they will like to withdraw care.  AKI (acute kidney injury): - Likely due to sepsis, start IV fluids. Check a basic metabolic panel in the morning. - Place a Foley monitor strict I.'s and O.'s.   Code Status: DNR/DNI Family Communication: son Disposition Plan:  inpatient  Time spent: 90 minutes  Marinda Elk [facility] Pager [phone]

## 2013-04-30 NOTE — ED Notes (Signed)
Pt returned from CT °

## 2013-04-30 NOTE — ED Notes (Signed)
Pt had BM runny brown; cleaned; turned; new depends placed.

## 2013-04-30 NOTE — ED Notes (Addendum)
Family found pt facedown next to bed; audible rails; vomited prior to EMS arrival. Sats in 70s at first. CPAP placed; sats came up 93% and lungs cleared. Afib rate controlled currently. Hx of dementia; walker seen at house.

## 2013-04-30 NOTE — ED Notes (Signed)
Pt more alert; family at bedside talking to him.

## 2013-04-30 NOTE — ED Notes (Signed)
Nurse on 2C to call back for report.

## 2013-04-30 NOTE — ED Notes (Signed)
Admitting MD at bedside talking to family.  

## 2013-04-30 NOTE — ED Notes (Signed)
MD at bedside talking with family

## 2013-04-30 NOTE — ED Notes (Signed)
RT at bedside; heading to scanner 2

## 2013-04-30 NOTE — ED Notes (Signed)
Condom cath placed by tech.

## 2013-04-30 NOTE — ED Notes (Signed)
Hospitalist wants to try non rebreather. RT called.

## 2013-04-30 NOTE — ED Notes (Signed)
Xray called to come stat

## 2013-04-30 NOTE — ED Notes (Signed)
Per MD; no bolus given at this time; fluid at 14200ml/ hr.

## 2013-04-30 NOTE — ED Notes (Signed)
Water and coffee given to family.

## 2013-04-30 NOTE — ED Notes (Signed)
Respiratory to come so pt can be transported to CT.

## 2013-04-30 NOTE — ED Provider Notes (Signed)
CSN: [id number]     Arrival date & time 04/30/13  0736 History   First MD Initiated Contact with Patient 04/30/13 [phone]     Chief Complaint  Patient presents with  . Shortness of Breath     (Consider location/radiation/quality/duration/timing/severity/associated sxs/prior Treatment) HPI  80 year old male brought in by EMS. Patient lives at home with his son. He seem to be in his usual state of health yesterday before he went to bed. Vomiting throughout the night. This morning his son try to help to the bathroom when he fell to the ground he was unable to get up by himself. Vomitus appear dark in color. Son reports a past history of ulcers. History dementia but at baseline he's ambulatory with assistance of a walker and speaks although often doesn't make sense. No blood thinners aside from aspirin. No history GI bleed that son is aware. Does not think he sustained any significant injuries from the fall itself as son helped lower him to the ground. Patient is DO NOT RESUSCITATE. EMS reports pt hypotensive and had rhonchi on their arrival. Hypoxic and placed on PPV which he arrived to ED on. Pt lethargic but will open eyes briefly to loud voice and answer some very basic questions. He denies pain anywhere.    Past Medical History  Diagnosis Date  . Atrial fibrillation   . Dementia    No past surgical history on file. No family history on file. History  Substance Use Topics  . Smoking status: Former Smoker    Quit date: 06/13/1986  . Smokeless tobacco: Not on file  . Alcohol Use: Not on file    Review of Systems  Level 5 caveat because of severe illness and dementia.   Allergies  Penicillins  Home Medications   Current Outpatient Rx  Name  Route  Sig  Dispense  Refill  . amLODipine (NORVASC) 5 MG tablet      One daily   90 tablet   3   . Ferrous Sulfate Dried (SLOW RELEASE IRON) 45 MG TBCR   Oral   Take 40 mg by mouth daily.         . fish oil-omega-3 fatty acids 1000 MG  capsule   Oral   Take by mouth daily.         Boris Lown Oil 300 MG CAPS   Oral   Take 300 mg by mouth daily.         . Multiple Vitamin (MULTIVITAMIN) tablet   Oral   Take 1 tablet by mouth daily.          BP 89/54  Pulse 105  Resp 23  SpO2 96% Physical Exam  Nursing note and vitals reviewed. Constitutional: He appears well-nourished.  HENT:  Head: Normocephalic and atraumatic.  Eyes: Conjunctivae are normal. Pupils are equal, round, and reactive to light. Right eye exhibits no discharge. Left eye exhibits no discharge.  Neck: Neck supple.  Cardiovascular: Regular rhythm and normal heart sounds.  Exam reveals no gallop and no friction rub.   No murmur heard. tachcyardic  Pulmonary/Chest: Breath sounds normal. He is in respiratory distress.  On BiPAP. Tachypneic around 26-28. Sounds clear to me.   Abdominal: Soft. He exhibits no distension. There is no tenderness.  Soft. Equivocal distension. Son reports appears abdomen appears like it typically does. No surgical scars noted. Tympany. No apparent reaction to deep palpation in all quadrants. Denies pain with palpation when asked.   Musculoskeletal: He exhibits no edema and no  tenderness.  Neurological:  Very drowsy. Opens eyes to loud voice. Follows simple commands (squeeze hands, move toes). Doesn't seem to have focal neuro deficit. No facial asymmetry.   Skin: Skin is warm and dry.  Psychiatric: He has a normal mood and affect. His behavior is normal. Thought content normal.    ED Course  Procedures (including critical care time)  CRITICAL CARE Performed by: Raeford Razor  Total critical care time: 35 minutes  Critical care time was exclusive of separately billable procedures and treating other patients. Critical care was necessary to treat or prevent imminent or life-threatening deterioration. Critical care was time spent personally by me on the following activities: development of treatment plan with patient  and/or surrogate as well as nursing, discussions with consultants, evaluation of patient's response to treatment, examination of patient, obtaining history from patient or surrogate, ordering and performing treatments and interventions, ordering and review of laboratory studies, ordering and review of radiographic studies, pulse oximetry and re-evaluation of patient's condition.  Labs Review Labs Reviewed  CBC WITH DIFFERENTIAL - Abnormal; Notable for the following:    WBC 13.2 (*)    RBC 3.75 (*)    Hemoglobin 12.6 (*)    HCT 38.0 (*)    MCV 101.3 (*)    Neutrophils Relative % 91 (*)    Neutro Abs 12.0 (*)    Lymphocytes Relative 7 (*)    Monocytes Relative 2 (*)    All other components within normal limits  BASIC METABOLIC PANEL - Abnormal; Notable for the following:    Potassium 3.6 (*)    Glucose, Bld 204 (*)    BUN 27 (*)    Creatinine, Ser 1.38 (*)    GFR calc non Af Amer 42 (*)    GFR calc Af Amer 49 (*)    All other components within normal limits  PRO B NATRIURETIC PEPTIDE - Abnormal; Notable for the following:    Pro B Natriuretic peptide (BNP) 523.6 (*)    All other components within normal limits  LACTIC ACID, PLASMA - Abnormal; Notable for the following:    Lactic Acid, Venous 10.6 (*)    All other components within normal limits  I-STAT ARTERIAL BLOOD GAS, ED - Abnormal; Notable for the following:    pH, Arterial 7.283 (*)    pCO2 arterial 34.1 (*)    pO2, Arterial 156.0 (*)    Bicarbonate 16.1 (*)    Acid-base deficit 10.0 (*)    All other components within normal limits  CULTURE, BLOOD (ROUTINE X 2)  CULTURE, BLOOD (ROUTINE X 2)  TROPONIN I  PROTIME-INR  URINALYSIS, ROUTINE W REFLEX MICROSCOPIC  BLOOD GAS, ARTERIAL  TYPE AND SCREEN   Imaging Review Ct Abdomen Pelvis Wo Contrast  04/30/2013   CLINICAL DATA:  Shortness breath, history of recent fall  EXAM: CT ABDOMEN AND PELVIS WITHOUT CONTRAST  TECHNIQUE: Multidetector CT imaging of the abdomen and pelvis  was performed following the standard protocol without intravenous contrast.  COMPARISON:  None.  FINDINGS: Areas of increased density are appreciated within the lung bases left greater than right. Areas of consolidation appreciated on the left.  The stomach is moderately dilated and fluid-filled.  Noncontrast evaluation of the liver, spleen, adrenals, pancreas is unremarkable.  Portions of the proximal and transverse colon are interposed between the liver and the anterior abdominal wall.  Small low attenuating exophytic nodules are appreciated involving the right and left kidneys. The largest is on the left measuring 1.5 cm in diameter.  Hounsfield units of these regions are consistent with fluid and these areas likely represent cysts. Further evaluation with renal ultrasound for further characterization is recommended. Noncontrast evaluation kidney is otherwise unremarkable.  A mild-to-moderate amount of stool is appreciated within the colon. Multiple fluid-filled loops of nondilated small bowel are appreciated primarily within the left side of the abdomen and pelvis. Otherwise no evidence of bowel obstruction. The appendix is identified and is unremarkable.  Within the limitations of a noncontrast CT no abdominal or pelvic masses, free fluid, loculated fluid collections, nor adenopathy.  No abdominal aortic aneurysm. Scattered atherosclerotic calcifications appreciated.  Degenerative changes are appreciated within the hips and lumbar spine, without aggressive appearing osseous lesions.  No abdominal wall hernia appreciated. A small fat containing inguinal hernia on the left.  IMPRESSION: 1. Dilated fluid-filled stomach and multiple fluid-filled loops of small bowel. The bowel pattern presently is nonobstructive with no an evolving ileus cannot be excluded. Surveillance evaluation with plain film abdominal radiograph recommended. 2. Indeterminate nodules in the kidneys these findings may possibly be further  characterized with ultrasound 3. Findings concerning for left lower lobe infiltrate. Component of atelectasis within the lung bases also a diagnostic consideration. 4. Small fat containing inguinal hernia on the left without further focal or acute abnormalities.   Electronically Signed   By: Salome Holmes M.D.   On: 04/30/2013 09:17   Ct Head Wo Contrast  04/30/2013   CLINICAL DATA:  Found on floor  EXAM: CT HEAD WITHOUT CONTRAST  TECHNIQUE: Contiguous axial images were obtained from the base of the skull through the vertex without intravenous contrast.  COMPARISON:  None.  FINDINGS: Generalized atrophy. Microvascular ischemic changes are present bilaterally which appear chronic.  Negative for acute infarct. Negative for hemorrhage mass or skull fracture.  Air-fluid levels in the maxillary sinus bilaterally. Mild atherosclerotic calcification in the cavernous carotid.  IMPRESSION: Atrophy and chronic microvascular ischemia.  No acute abnormality.   Electronically Signed   By: Marlan Palau M.D.   On: 04/30/2013 09:05   Dg Chest Portable 1 View  04/30/2013   CLINICAL DATA:  Shortness of breath.  Former smoker.  Dementia.  EXAM: PORTABLE CHEST - 1 VIEW  COMPARISON:  None.  FINDINGS: Pneumoperitoneum localizing beneath the right hemidiaphragm. Cardiac silhouette normal in size. Thoracic aorta mildly atherosclerotic. Hilar and mediastinal contours otherwise unremarkable. Airspace consolidation in the retrocardiac left lung base. Lungs otherwise clear. Pulmonary vascularity normal. No pneumothorax. No pleural effusions.  IMPRESSION: 1. Pneumoperitoneum. 2. Left lower lobe pneumonia. Critical v patent the alue/emergent results were called by telephone at the time of interpretation on 04/30/2013 at 7:57 AM to Dr. Raeford Razor, who verbally acknowledged these results.   Electronically Signed   By: Hulan Saas M.D.   On: 04/30/2013 08:02     EKG Interpretation   Date/Time:  Tuesday April 30 2013 07:56:04  EDT Ventricular Rate:  106 PR Interval:  174 QRS Duration: 95 QT Interval:  336 QTC Calculation: 446 R Axis:   79 Text Interpretation:  Likely sinus tachycardia PACs and PVCs Non-specific  ST-t changes No old tracing to compare Confirmed by Denisse Whitenack  MD, Caress Reffitt  ([id number]) on 04/30/2013 10:11:51 AM      MDM   Final diagnoses:  CAP (community acquired pneumonia)  Severe sepsis    8:06 AM CXR with what appears to be large amount of free air.  Son reports hx of ulcer. Reports vomitus was "dark" but that "eats a lot of chocolate." Pt DNR. Discussed  with son. Understands that would quickly develop peritonitis and die if did not have surgery. Would not want to pursue surgery which I think is reasonable as outcome likely very poor even if he did. Surgery consultation deferred. Will CT to confirm as pt does not examine like a perforated viscus. Abdomen soft. Does seem tympanitic, although not distended. Doesn't seem tender although exam limited by decreased mental status. He is able to answer some simple questions and denies pain. At this time will continue current care with plan of deescalation to comfort measures if indeed has perforation.   9:22 AM CT w/o evidence of free air. Perforation or not, pt is critically ill. Further discussion with son and additional family. Would like to continue current care (NIPPV, IV fluids/meds, blood draws and imaging as indicated). Will discuss with medicine for step-down admission. PCP Dr Milus GlazierLauenstein.   Raeford RazorStephen Dorenda Pfannenstiel, MD 04/30/13 1013

## 2013-04-30 NOTE — Progress Notes (Signed)
Transported to CT on BiPap 14/10 60%.  Patient tolerated well.  No complications noted.

## 2013-04-30 NOTE — ED Notes (Signed)
Respiratory at bedside to adjust cpap. Pt alert and talking; breathing even and fast.

## 2013-04-30 NOTE — ED Notes (Signed)
RN discussed with MD unsuccessful attempt at in and out due to foreskin anatomy issues; MD suggested condom cath. Tech made aware.

## 2013-04-30 NOTE — ED Notes (Signed)
Son at bedside.

## 2013-05-01 DIAGNOSIS — J189 Pneumonia, unspecified organism: Secondary | ICD-10-CM

## 2013-05-01 LAB — COMPREHENSIVE METABOLIC PANEL
ALBUMIN: 2.6 g/dL — AB (ref 3.5–5.2)
ALT: 16 U/L (ref 0–53)
AST: 25 U/L (ref 0–37)
Alkaline Phosphatase: 51 U/L (ref 39–117)
BILIRUBIN TOTAL: 0.4 mg/dL (ref 0.3–1.2)
BUN: 38 mg/dL — AB (ref 6–23)
CHLORIDE: 110 meq/L (ref 96–112)
CO2: 22 mEq/L (ref 19–32)
Calcium: 8.4 mg/dL (ref 8.4–10.5)
Creatinine, Ser: 1.18 mg/dL (ref 0.50–1.35)
GFR calc Af Amer: 59 mL/min — ABNORMAL LOW (ref >90)
GFR calc non Af Amer: 51 mL/min — ABNORMAL LOW (ref >90)
Glucose, Bld: 97 mg/dL (ref 70–99)
POTASSIUM: 3.8 meq/L (ref 3.7–5.3)
Sodium: 143 mEq/L (ref 137–147)
TOTAL PROTEIN: 5.4 g/dL — AB (ref 6.0–8.3)

## 2013-05-01 LAB — CBC
HEMATOCRIT: 28.4 % — AB (ref 39.0–52.0)
Hemoglobin: 9.7 g/dL — ABNORMAL LOW (ref 13.0–17.0)
MCH: 33.6 pg (ref 26.0–34.0)
MCHC: 34.2 g/dL (ref 30.0–36.0)
MCV: 98.3 fL (ref 78.0–100.0)
Platelets: 192 10*3/uL (ref 150–400)
RBC: 2.89 MIL/uL — ABNORMAL LOW (ref 4.22–5.81)
RDW: 13.7 % (ref 11.5–15.5)
WBC: 11.8 10*3/uL — AB (ref 4.0–10.5)

## 2013-05-01 LAB — LACTIC ACID, PLASMA: Lactic Acid, Venous: 0.9 mmol/L (ref 0.5–2.2)

## 2013-05-01 LAB — HIV ANTIBODY (ROUTINE TESTING W REFLEX): HIV 1&2 Ab, 4th Generation: NONREACTIVE

## 2013-05-01 MED ORDER — MORPHINE SULFATE 2 MG/ML IJ SOLN
1.0000 mg | INTRAMUSCULAR | Status: DC | PRN
  Administered 2013-05-05: 1 mg via INTRAVENOUS
  Filled 2013-05-01: qty 1

## 2013-05-01 NOTE — Progress Notes (Addendum)
[place] TEAM 1 - Stepdown/ICU TEAM Progress Note  Stephenie AcresCharles Lemelle ZOX:[id number] DOB: 02/13/1917 DOA: 04/30/2013 PCP: Elvina SidleLAUENSTEIN,KURT, MD    Admit HPI / Brief Narrative: 80 year old with past medical history of dementia and atrial fibrillation he lives with his son and most of the history was obtained by him as the patient was confused. He started vomiting on the day prior to admission before he went to bed. The morning of his admit when his son got up to the bathroom he found him on the ground unable to answer questions. His son related he usually uses a walker but he couldn't walk. He remained confused most of the day but had no fever chills or cough.   In the ED:  After normal saline he became more responsive but was not making any sense. An ABG was done that showed a pH of 7.28 PCO2 of 34 PO2 of 156 on BiPAP. CBC showed a white count of 13 with a left shift - chest x-ray showed L lower lobe infiltrate.  HPI/Subjective: The patient is noncommunicative during my exam this morning.  There is no family present.  Assessment/Plan:  Respiratory failure, acute / LLL CAP  Unclear if this represents community acquired pneumonia or possibly an aspiration event - continue empiric antibiotics and follow for clinical improvement  Sepsis (white blood cell 13.2, HR 96, CAP) Sepsis physiology is improving - continue supportive care  AKI  Likely simple prerenal azotemia - continue to hydrate and follow creatinine  Toxic metabolic encephalopathy in setting of chronic Dementia CT of head at admit revealed no acute findings - continue to treat acute illness and follow mental status  Possible UTI UA not normal - could be related to DH/CAP - send urine cx and f/u   Possible early ileus Noted on CT abdom - follow clinical exam - n.p.o. for now regardless due to altered mental status  Nodules in B kidneys indeterminate via CT scan - consider US if w/u indicated after pt stabilizes further  Code  Status: DO NOT RESUSCITATE Family Communication: no family present at time of exam Disposition Plan: SDU  Consultants: None  Procedures: None  Antibiotics: Azithromycin 4/7>> Cefepime 4/7 Ceftriaxone 4/7>> Flagyl 4/7 Vancomycin 4/7  DVT prophylaxis: SQ heparin  Objective: Blood pressure 133/78, pulse 90, temperature 98.9 F (37.2 C), temperature source Oral, resp. rate 20, weight 66.8 kg (147 lb 4.3 oz), SpO2 94.00%.  Intake/Output Summary (Last 24 hours) at 05/01/13 1156 Last data filed at 05/01/13 1100  Gross per 24 hour  Intake   1753 ml  Output    925 ml  Net    828 ml   Exam: General: No acute respiratory distress Lungs: Coarse crackles bilateral bases left greater than right with no wheeze Cardiovascular: Regular rate and rhythm without murmur gallop or rub Abdomen: Nontender, nondistended, soft, bowel sounds positive, no rebound, no ascites, no appreciable mass Extremities: No significant cyanosis, clubbing, or edema bilateral lower extremities  Data Reviewed: Basic Metabolic Panel:  Recent Labs Lab 04/30/13 0800 05/01/13 0355  NA 145 143  K 3.6* 3.8  CL 100 110  CO2 20 22  GLUCOSE 204* 97  BUN 27* 38*  CREATININE 1.38* 1.18  CALCIUM 9.1 8.4   Liver Function Tests:  Recent Labs Lab 05/01/13 0355  AST 25  ALT 16  ALKPHOS 51  BILITOT 0.4  PROT 5.4*  ALBUMIN 2.6*   CBC:  Recent Labs Lab 04/30/13 0800 05/01/13 0355  WBC 13.2* 11.8*  NEUTROABS 12.0*  --  HGB 12.6* 9.7*  HCT 38.0* 28.4*  MCV 101.3* 98.3  PLT 327 192   Cardiac Enzymes:  Recent Labs Lab 04/30/13 0800  TROPONINI <0.30   BNP (last 3 results)  Recent Labs  04/30/13 0800  PROBNP 523.6*    Recent Results (from the past 240 hour(s))  CULTURE, BLOOD (ROUTINE X 2)     Status: None   Collection Time    04/30/13  8:00 AM      Result Value Ref Range Status   Specimen Description BLOOD RIGHT ANTECUBITAL   Final   Special Requests BOTTLES DRAWN AEROBIC AND  ANAEROBIC   Final   Culture  Setup Time     Final   Value: 04/30/2013 13:31     Performed at [facility]   Culture     Final   Value:        BLOOD CULTURE RECEIVED NO GROWTH TO DATE CULTURE WILL BE HELD FOR 5 DAYS BEFORE ISSUING A FINAL NEGATIVE REPORT     Performed at [facility]   Report Status PENDING   Incomplete  MRSA PCR SCREENING     Status: None   Collection Time    04/30/13  4:05 PM      Result Value Ref Range Status   MRSA by PCR NEGATIVE  NEGATIVE Final   Comment:            The GeneXpert MRSA Assay (FDA     approved for NASAL specimens     only), is one component of a     comprehensive MRSA colonization     surveillance program. It is not     intended to diagnose MRSA     infection nor to guide or     monitor treatment for     MRSA infections.  CULTURE, BLOOD (ROUTINE X 2)     Status: None   Collection Time    04/30/13  5:15 PM      Result Value Ref Range Status   Specimen Description BLOOD RIGHT HAND   Final   Special Requests BOTTLES DRAWN AEROBIC ONLY 5CC   Final   Culture  Setup Time     Final   Value: 04/30/2013 21:17     Performed at [facility]   Culture     Final   Value:        BLOOD CULTURE RECEIVED NO GROWTH TO DATE CULTURE WILL BE HELD FOR 5 DAYS BEFORE ISSUING A FINAL NEGATIVE REPORT     Performed at [facility]   Report Status PENDING   Incomplete     Studies:  Recent x-ray studies have been reviewed in detail by the Attending Physician  Scheduled Meds:  Scheduled Meds: . aspirin EC  81 mg Oral Daily  . azithromycin  500 mg Intravenous Q24H  . cefTRIAXone (ROCEPHIN)  IV  1 g Intravenous Q24H  . heparin  5,000 Units Subcutaneous 3 times per day  . sodium chloride  3 mL Intravenous Q12H    Time spent on care of this patient: 35 mins   Lonia Blood  [facility] Office  [phone] Pager - Text Page per Loretha Stapler as per below:  On-Call/Text Page:      [email or web address]      password  [email or web address]  If 7PM-7AM, please contact night-coverage [email or web address] Password [email or web address] 05/01/2013, 11:56 AM   LOS: 1 day

## 2013-05-01 NOTE — Progress Notes (Signed)
PT Cancellation Note  Patient Details Name: Ronald Prince MRN: [id number] DOB: [birth year]   Cancelled Treatment:    Reason Eval/Treat Not Completed: Patient not medically ready due to confusion.   [name] 05/01/2013, 1:29 PM  [employer] Khyson Sebesta PT [phone]

## 2013-05-01 NOTE — Progress Notes (Signed)
Utilization Review Completed.  

## 2013-05-01 NOTE — Progress Notes (Signed)
Alert to voice. Pt defensive during personal care: audible phrases with pt repeating what RN states. Safety maintained with bed alarm and mittens. Son visited: will introduce son to case manager this afternoon.Marland Kitchen. Awaiting orders for swallow evaluation.

## 2013-05-02 DIAGNOSIS — J9600 Acute respiratory failure, unspecified whether with hypoxia or hypercapnia: Secondary | ICD-10-CM

## 2013-05-02 DIAGNOSIS — N179 Acute kidney failure, unspecified: Secondary | ICD-10-CM

## 2013-05-02 DIAGNOSIS — R269 Unspecified abnormalities of gait and mobility: Secondary | ICD-10-CM

## 2013-05-02 DIAGNOSIS — R6520 Severe sepsis without septic shock: Secondary | ICD-10-CM

## 2013-05-02 DIAGNOSIS — A419 Sepsis, unspecified organism: Principal | ICD-10-CM

## 2013-05-02 LAB — CBC
HCT: 26.3 % — ABNORMAL LOW (ref 39.0–52.0)
Hemoglobin: 8.9 g/dL — ABNORMAL LOW (ref 13.0–17.0)
MCH: 33.5 pg (ref 26.0–34.0)
MCHC: 33.8 g/dL (ref 30.0–36.0)
MCV: 98.9 fL (ref 78.0–100.0)
PLATELETS: 173 10*3/uL (ref 150–400)
RBC: 2.66 MIL/uL — ABNORMAL LOW (ref 4.22–5.81)
RDW: 13.6 % (ref 11.5–15.5)
WBC: 8.4 10*3/uL (ref 4.0–10.5)

## 2013-05-02 LAB — BASIC METABOLIC PANEL
BUN: 28 mg/dL — ABNORMAL HIGH (ref 6–23)
CO2: 21 mEq/L (ref 19–32)
CREATININE: 0.96 mg/dL (ref 0.50–1.35)
Calcium: 8.3 mg/dL — ABNORMAL LOW (ref 8.4–10.5)
Chloride: 111 mEq/L (ref 96–112)
GFR, EST AFRICAN AMERICAN: 80 mL/min — AB (ref >90)
GFR, EST NON AFRICAN AMERICAN: 69 mL/min — AB (ref >90)
Glucose, Bld: 83 mg/dL (ref 70–99)
Potassium: 3.6 mEq/L — ABNORMAL LOW (ref 3.7–5.3)
Sodium: 144 mEq/L (ref 137–147)

## 2013-05-02 NOTE — Evaluation (Signed)
Physical Therapy Evaluation Patient Details Name: Ronald Prince MRN: [id number] DOB: February 06, 1917 Today's Date: 05/02/2013   History of Present Illness  80 year old with past medical history of dementia and atrial fibrillation he lives with his son. The morning of his admit his son found him on the ground unable to answer questions. His son related he usually uses a walker but he couldn't walk.  Clinical Impression  Pt admitted s/p fall (found on floor by his son). Pt with h/o dementia and currently lethargic with difficulty participating in activity. Pt currently with functional limitations due to the deficits listed below (see PT Problem List).  Pt may benefit from skilled PT to increase their independence and safety with mobility to allow discharge home with family. Trial of PT x 1 week to assess pt's needs/abilities.     Follow Up Recommendations Other (comment) (TBA as more alert/participatory)    Equipment Recommendations  Other (comment) (son wants bed rail to affix to pt's bed)    Recommendations for Other Services       Precautions / Restrictions Precautions Precautions: Fall      Mobility  Bed Mobility Overal bed mobility: Needs Assistance Bed Mobility: Rolling Rolling: Total assist         General bed mobility comments: no incr alertness with attempted mobility  Transfers                    Ambulation/Gait                Stairs            Wheelchair Mobility    Modified Rankin (Stroke Patients Only)       Balance                                             Pertinent Vitals/Pain VSS on ICU monitor    Home Living Family/patient expects to be discharged to:: Private residence Living Arrangements: Children;Other relatives Available Help at Discharge: Family Type of Home: House Home Access: Level entry     Home Layout: One level Home Equipment: Walker - 2 wheels;Toilet riser;Wheelchair - manual Additional  Comments: Son denies need for [facility] or hospital bed. He would like a bed rail to add to pt's bed.    Prior Function Level of Independence: Needs assistance   Gait / Transfers Assistance Needed: walks with supervision to min assist with RW; leans posterior; on "bad days" he can't walk and they push him in w/c           Hand Dominance        Extremity/Trunk Assessment   Upper Extremity Assessment: RUE deficits/detail;LUE deficits/detail RUE Deficits / Details: shoulder PROM flexion to 90, abdct to 70, elbow WFL     LUE Deficits / Details: shoulder PROM flexion 90, abdct 80, external rotation 10   Lower Extremity Assessment: RLE deficits/detail;LLE deficits/detail RLE Deficits / Details: PROM WFL; unable to assess strength due to lethargy LLE Deficits / Details: PROM WFL; unable to assess strength due to lethargy     Communication   Communication: Expressive difficulties (son reports usually talks OK, sometimes mumbles unintelligib)  Cognition Arousal/Alertness: Lethargic   Overall Cognitive Status: Difficult to assess                      General Comments General comments (skin integrity, edema,  etc.): Son arrived at end of session and provided history.     Exercises Other Exercises Other Exercises: Pt would arouse for up to 15 seconds and participate in LE heelslides, then drift off to sleep      Assessment/Plan    PT Assessment Patient needs continued PT services (trial of PT)  PT Diagnosis Generalized weakness   PT Problem List Decreased strength;Decreased range of motion;Decreased mobility;Decreased cognition;Decreased knowledge of use of DME  PT Treatment Interventions DME instruction;Gait training;Functional mobility training;Therapeutic activities;Therapeutic exercise;Balance training;Patient/family education;Cognitive remediation   PT Goals (Current goals can be found in the Care Plan section) Acute Rehab PT Goals Patient Stated Goal: pt unable; son  wants to take pt home, hopefully walking PT Goal Formulation: With family Time For Goal Achievement: 05/09/13 Potential to Achieve Goals: Fair    Frequency Min 2X/week   Barriers to discharge        Co-evaluation               End of Session   Activity Tolerance: Patient limited by lethargy Patient left: in bed;with call bell/phone within reach;with restraints reapplied (bil mitts) Nurse Communication: Mobility status;Other (comment) (remains lethargic)         Time: [id number] PT Time Calculation (min): 19 min   Charges:   PT Evaluation $Initial PT Evaluation Tier I: 1 Procedure PT Treatments $Therapeutic Exercise: 8-22 mins   PT G Codes:          Robby Bulkley P Kyara Boxer 05/02/2013, 12:12 PM Pager [phone]

## 2013-05-02 NOTE — Progress Notes (Addendum)
[facility] TEAM 1 - Stepdown/ICU TEAM Progress Note  Ronald Prince ZOX:[id number] DOB: [birth year] DOA: 04/30/2013 PCP: Elvina Sidle, MD    Admit HPI / Brief Narrative: 80 year old with past medical history of dementia and atrial fibrillation he lives with his son and most of the history was obtained by him as the patient was confused. He started vomiting on the day prior to admission before he went to bed. The morning of his admit when his son got up to the bathroom he found him on the ground unable to answer questions. His son related he usually uses a walker but he couldn't walk. He remained confused most of the day but had no fever chills or cough.   In the ED:  After normal saline he became more responsive but was not making any sense. An ABG was done that showed a pH of 7.28 PCO2 of 34 PO2 of 156 on BiPAP. CBC showed a white count of 13 with a left shift - chest x-ray showed L lower lobe infiltrate.  HPI/Subjective: Incomprehensible efforts at speech  Assessment/Plan:  Hypoxic Respiratory failure, acute / LLL CAP  Unclear if this represents community acquired pneumonia or possibly an aspiration event - continue empiric antibiotics and follow for clinical improvement  Sepsis (white blood cell 13.2, HR 96, CAP) Sepsis physiology is improving - continue supportive care-now hypertensive so will decrease IVFs to 50/hr  AKI  Likely simple prerenal azotemia - Scr stable and BUN trends down- IVFs as above  Toxic metabolic encephalopathy in setting of chronic Dementia CT of head at admit revealed no acute findings - continue to treat acute illness and follow mental status-SLP eval before allow POs  Possible UTI UA not normal - could be related to DH/CAP --urine cx not yet sent  Possible early ileus Noted on CT abdom - follow clinical exam - n.p.o. for now regardless due to altered mental status  Nodules in B kidneys indeterminate via CT scan - consider [place] if w/u indicated after pt  stabilizes further  Code Status: DO NOT RESUSCITATE Family Communication: no family present at time of exam Disposition Plan: Transfer to floor  Consultants: None  Procedures: None  Antibiotics: Azithromycin 4/7>> Cefepime 4/7 Ceftriaxone 4/7>> Flagyl 4/7 Vancomycin 4/7  DVT prophylaxis: SQ heparin  Objective: Blood pressure 158/70, pulse 86, temperature 99.7 F (37.6 C), temperature source Axillary, resp. rate 18, weight 147 lb 4.3 oz (66.8 kg), SpO2 96.00%.  Intake/Output Summary (Last 24 hours) at 05/02/13 1402 Last data filed at 05/02/13 1300  Gross per 24 hour  Intake   2560 ml  Output      0 ml  Net   2560 ml   Exam: General: No acute respiratory distress Lungs: CTA- weaning oxygen now down to 2L Cardiovascular: Regular rate and rhythm without murmur gallop or rub Abdomen: Nontender, nondistended, soft, bowel sounds positive, no rebound, no ascites, no appreciable mass Extremities: No significant cyanosis, clubbing, or edema bilateral lower extremities  Data Reviewed: Basic Metabolic Panel:  Recent Labs Lab 04/30/13 0800 05/01/13 0355 05/02/13 0330  NA 145 143 144  K 3.6* 3.8 3.6*  CL 100 110 111  CO2 20 22 21   GLUCOSE 204* 97 83  BUN 27* 38* 28*  CREATININE 1.38* 1.18 0.96  CALCIUM 9.1 8.4 8.3*   Liver Function Tests:  Recent Labs Lab 05/01/13 0355  AST 25  ALT 16  ALKPHOS 51  BILITOT 0.4  PROT 5.4*  ALBUMIN 2.6*   CBC:  Recent Labs Lab 04/30/13  0800 05/01/13 0355 05/02/13 0330  WBC 13.2* 11.8* 8.4  NEUTROABS 12.0*  --   --   HGB 12.6* 9.7* 8.9*  HCT 38.0* 28.4* 26.3*  MCV 101.3* 98.3 98.9  PLT 327 192 173   Cardiac Enzymes:  Recent Labs Lab 04/30/13 0800  TROPONINI <0.30   BNP (last 3 results)  Recent Labs  04/30/13 0800  PROBNP 523.6*    Recent Results (from the past 240 hour(s))  CULTURE, BLOOD (ROUTINE X 2)     Status: None   Collection Time    04/30/13  8:00 AM      Result Value Ref Range Status    Specimen Description BLOOD RIGHT ANTECUBITAL   Final   Special Requests BOTTLES DRAWN AEROBIC AND ANAEROBIC   Final   Culture  Setup Time     Final   Value: 04/30/2013 13:31     Performed at [facility]   Culture     Final   Value:        BLOOD CULTURE RECEIVED NO GROWTH TO DATE CULTURE WILL BE HELD FOR 5 DAYS BEFORE ISSUING A FINAL NEGATIVE REPORT     Performed at [facility]   Report Status PENDING   Incomplete  MRSA PCR SCREENING     Status: None   Collection Time    04/30/13  4:05 PM      Result Value Ref Range Status   MRSA by PCR NEGATIVE  NEGATIVE Final   Comment:            The GeneXpert MRSA Assay (FDA     approved for NASAL specimens     only), is one component of a     comprehensive MRSA colonization     surveillance program. It is not     intended to diagnose MRSA     infection nor to guide or     monitor treatment for     MRSA infections.  CULTURE, BLOOD (ROUTINE X 2)     Status: None   Collection Time    04/30/13  5:15 PM      Result Value Ref Range Status   Specimen Description BLOOD RIGHT HAND   Final   Special Requests BOTTLES DRAWN AEROBIC ONLY 5CC   Final   Culture  Setup Time     Final   Value: 04/30/2013 21:17     Performed at [facility]   Culture     Final   Value:        BLOOD CULTURE RECEIVED NO GROWTH TO DATE CULTURE WILL BE HELD FOR 5 DAYS BEFORE ISSUING A FINAL NEGATIVE REPORT     Performed at [facility]   Report Status PENDING   Incomplete     Studies:  Recent x-ray studies have been reviewed in detail by the Attending Physician  Scheduled Meds:  Scheduled Meds: . azithromycin  500 mg Intravenous Q24H  . cefTRIAXone (ROCEPHIN)  IV  1 g Intravenous Q24H  . heparin  5,000 Units Subcutaneous 3 times per day  . sodium chloride  3 mL Intravenous Q12H    Time spent on care of this patient: 35 mins   Russella Dar ANP  [facility] Office  [phone] Pager - Text Page per Loretha Stapler as per  below:  On-Call/Text Page:      [email or web address]      password [email or web address]  If 7PM-7AM, please contact night-coverage [email or web address] Password [email or web address] 05/02/2013, 2:02 PM   LOS: 2  days     I have personally examined this patient and reviewed the entire database. I have reviewed the above note, made any necessary editorial changes, and agree with its content. Assessment and plan as above, d/w patient his son Ronald Prince

## 2013-05-03 ENCOUNTER — Inpatient Hospital Stay (HOSPITAL_COMMUNITY): Payer: Medicare Other

## 2013-05-03 LAB — BASIC METABOLIC PANEL
BUN: 19 mg/dL (ref 6–23)
CALCIUM: 8.2 mg/dL — AB (ref 8.4–10.5)
CHLORIDE: 106 meq/L (ref 96–112)
CO2: 22 meq/L (ref 19–32)
Creatinine, Ser: 0.82 mg/dL (ref 0.50–1.35)
GFR calc Af Amer: 86 mL/min — ABNORMAL LOW (ref >90)
GFR calc non Af Amer: 74 mL/min — ABNORMAL LOW (ref >90)
Glucose, Bld: 84 mg/dL (ref 70–99)
Potassium: 3.4 mEq/L — ABNORMAL LOW (ref 3.7–5.3)
Sodium: 142 mEq/L (ref 137–147)

## 2013-05-03 LAB — CBC
HCT: 25.8 % — ABNORMAL LOW (ref 39.0–52.0)
Hemoglobin: 8.9 g/dL — ABNORMAL LOW (ref 13.0–17.0)
MCH: 33.3 pg (ref 26.0–34.0)
MCHC: 34.5 g/dL (ref 30.0–36.0)
MCV: 96.6 fL (ref 78.0–100.0)
PLATELETS: 184 10*3/uL (ref 150–400)
RBC: 2.67 MIL/uL — AB (ref 4.22–5.81)
RDW: 12.9 % (ref 11.5–15.5)
WBC: 7 10*3/uL (ref 4.0–10.5)

## 2013-05-03 LAB — STREP PNEUMONIAE URINARY ANTIGEN: STREP PNEUMO URINARY ANTIGEN: NEGATIVE

## 2013-05-03 MED ORDER — METOPROLOL TARTRATE 1 MG/ML IV SOLN
5.0000 mg | Freq: Four times a day (QID) | INTRAVENOUS | Status: DC
  Administered 2013-05-03 – 2013-05-10 (×29): 5 mg via INTRAVENOUS
  Filled 2013-05-03 (×38): qty 5

## 2013-05-03 MED ORDER — SODIUM CHLORIDE 0.9 % IV SOLN: INTRAVENOUS | Status: DC

## 2013-05-03 MED ORDER — POTASSIUM CHLORIDE IN NACL 20-0.9 MEQ/L-% IV SOLN
INTRAVENOUS | Status: DC
  Administered 2013-05-03: 13:00:00 via INTRAVENOUS
  Administered 2013-05-04: 1000 mL via INTRAVENOUS
  Administered 2013-05-05 – 2013-05-09 (×5): via INTRAVENOUS
  Filled 2013-05-03 (×17): qty 1000

## 2013-05-03 NOTE — Progress Notes (Signed)
Gave bed offers from SNFs to pt's son today. Pt's son chooses [facility]. FL2 has already been signed, so CSW has taken this off chart to start discharge packet. [facility] requested progress notes, and I have sent these to the facility. Restraint mittens must be discontinued for at least 24 hours before facility can accept pt.   Ronald LabradorJulie Anupama Piehl, MSW, [facility] Clinical Social Worker [phone]

## 2013-05-03 NOTE — Progress Notes (Addendum)
Clinical Social Work Department CLINICAL SOCIAL WORK PLACEMENT NOTE 05/03/2013  Patient:  Ronald Prince,Ronald Prince  Account Number:  [id number] Admit date:  04/30/2013  Clinical Social Worker:  Maryclare LabradorJULIE Vallory Oetken, Theresia MajorsLCSWA  Date/time:  05/02/2013 08:46 AM  Clinical Social Work is seeking post-discharge placement for this patient at the following level of care:   SKILLED NURSING   (*CSW will update this form in Epic as items are completed)   05/02/2013  Patient/family provided with [facility] Department of Clinical Social Work's list of facilities offering this level of care within the geographic area requested by the patient (or if unable, by the patient's family).  05/02/2013  Patient/family informed of their freedom to choose among providers that offer the needed level of care, that participate in Medicare, Medicaid or managed care program needed by the patient, have an available bed and are willing to accept the patient.  05/02/2013  Patient/family informed of [facility]' ownership interest in [facility], as well as of the fact that they are under no obligation to receive care at this facility.  PASARR submitted to EDS on 05/02/2013 PASARR number received from EDS on   FL2 transmitted to all facilities in geographic area requested by pt/family on  05/02/2013 FL2 transmitted to all facilities within larger geographic area on   Patient informed that his/her managed care company has contracts with or will negotiate with  certain facilities, including the following:     Patient/family informed of bed offers received:  05/03/2013 [facility]ician recommends and patient chooses bed at    [facility]   [facility]  The following physician request were entered in Epic:   Additional Comments: [facility]. Son chooses [facility].  Explained CSW takes care of informing facility when pt is discharging and transportation, and family will need to go to facility to complete paperwork. Provided name and contact number for facility admissions to do this.    Maryclare LabradorJulie Chael Urenda, MSW, [facility]SWA Clinical Social Worker [phone]

## 2013-05-03 NOTE — Progress Notes (Signed)
Physical Therapy Treatment Patient Details Name: Ronald Prince MRN: [id number] DOB: 29-Apr-1917 Today's Date: 05/03/2013    History of Present Illness 80 year old with past medical history of dementia and atrial fibrillation he lives with his son. The morning of his admit his son found him on the ground unable to answer questions. His son related he usually uses a walker but he couldn't walk.    PT Comments    Pt more alert today and able to get to EOB and attempt standing however pt with such a significant posterior bias in sitting and with attempts at standing could not transfer to chair with 2 person assist. Pt with very garbled speech and difficult to understand. Pt with limited ability to follow commands and Rn notified of pt position and status end of session. Pt currently most appropriate for SNF unless significant changes acutely or son able to provide 2 person assist for mobility OOb at the very least. Will continue to follow and currently recommend OOB via lift equipment.  Follow Up Recommendations  SNF;Supervision/Assistance - 24 hour     Equipment Recommendations       Recommendations for Other Services       Precautions / Restrictions Precautions Precautions: Fall    Mobility  Bed Mobility Overal bed mobility: +2 for physical assistance Bed Mobility: Rolling;Sidelying to Sit;Sit to Supine Rolling: Total assist Sidelying to sit: Max assist;+2 for physical assistance   Sit to supine: Max assist;+2 for physical assistance   General bed mobility comments: pt with ability to maintain knee flexion but not follwing other commands to reach for rail or assist with transition to or from sitting. On arrival pt with all linens soaked and rolled x 2 bilaterally for pericare, linen change and mobility with RN notified  Transfers Overall transfer level: Needs assistance Equipment used: None Transfers: Sit to/from Stand Sit to Stand: Total assist;+2 physical assistance;From  elevated surface         General transfer comment: attempted x 4 to stand pt with 2 person assist, pad, belt and elevated bed but pt with such significant posterior lean that we were unable to shift his weight or trunk anteriorly for safe standing although able to clear buttocks x 2 then returned to bed  Ambulation/Gait                 Stairs            Wheelchair Mobility    Modified Rankin (Stroke Patients Only)       Balance Overall balance assessment: Needs assistance Sitting-balance support: Feet supported;Single extremity supported Sitting balance-Leahy Scale: Zero     Standing balance support: Bilateral upper extremity supported Standing balance-Leahy Scale: Zero                      Cognition Arousal/Alertness: Awake/alert Behavior During Therapy: Flat affect Overall Cognitive Status: Difficult to assess                      Exercises      General Comments        Pertinent Vitals/Pain VSS PAINAD= 0    Home Living                      Prior Function            PT Goals (current goals can now be found in the care plan section) Progress towards PT goals: Not progressing toward goals - comment (  limited mental status and inability to follow commands)    Frequency  Min 2X/week    PT Plan      Co-evaluation             End of Session Equipment Utilized During Treatment: Gait belt Activity Tolerance: Other (comment) (limited by AMS and inability to follow commands) Patient left: in bed;with call bell/phone within reach;with bed alarm set;with restraints reapplied (bil mittens reapplied)     Time: 8119-14781222-1245 PT Time Calculation (min): 23 min  Charges:  $Therapeutic Activity: 23-37 mins                    G Codes:      Jsiah Menta B Paxson Harrower 05/03/2013, 1:52 PM Delaney MeigsMaija Tabor Aldina Porta, PT [phone]

## 2013-05-03 NOTE — Procedures (Signed)
Objective Swallowing Evaluation: Modified Barium Swallowing Study  Patient Details  Name: Ronald Prince MRN: [id number] Date of Birth: [birth year]  Today's Date: 05/03/2013 Time: 4098-1191 SLP Time Calculation (min): 17 min  Past Medical History:  Past Medical History  Diagnosis Date  . Atrial fibrillation   . Dementia   . Hypertension   . Shortness of breath   . Pneumonia 04/2013  . GERD (gastroesophageal reflux disease)    Past Surgical History:  Past Surgical History  Procedure Laterality Date  . Cataract extraction     HPI:  80 year old with past medical history of dementia and atrial fibrillation he lives with his son and most of the history was obtained by him as the patient was confused. He started vomiting on the day prior to admission before he went to bed. The morning of his admit when his son got up to the bathroom he found him on the ground unable to answer questions. His son related he usually uses a walker but he couldn't walk. He remained confused most of the day but had no fever chills or cough. In the ED: After normal saline he became more responsive but was not making any sense. Chest x-ray showed L lower lobe infiltrate. MD reports sepsis.     Assessment / Plan / Recommendation Clinical Impression  Dysphagia Diagnosis: Severe oral phase dysphagia;Severe pharyngeal phase dysphagia Clinical impression: Pt demonstrates a severe oral dyspahgia with weak lingual manipulation and pumping behavior to transit bolus, leaving a moderate amount of oral residuals, mixing with secretions in primarily left buccal cavity. There is premature spillage to the valleculae with liquids pooling there (pt slightly forward leaning during test, suspect he would immediately aspirate liquids when lying slightly posteriorally in bed).   With all liquid trials, despite thickness or bolus size pt has some spillage into the airway before the swallow. Though airway closure is adequate and penetrates  are often expelled during the swallow, signfiicant base of tongue weakness and hyolaryngeal weakness lead to vallecular and pyriform sinus residuals that are aspirated post swallow. Even purees pose high risk as residuals mix with secretions. A chin tuck does decrease residue, but pt would not be reliable.   Overall, pts dysphagia is severe and risk of aspiration with all textures is high. MD reports pt is not expected to have rapid significant recovery of function, so discussion regarding goals of care is warranted. For now pt should remain NPO, SLP will follow for trials of puree with attempted compensatory strategies.     Treatment Recommendation  Therapy as outlined in treatment plan below    Diet Recommendation NPO   Medication Administration: Via alternative means    Other  Recommendations Recommended Consults: MBS Oral Care Recommendations: Oral care Q4 per protocol   Follow Up Recommendations       Frequency and Duration min 2x/week  2 weeks   Pertinent Vitals/Pain NA    SLP Swallow Goals     General HPI: 80 year old with past medical history of dementia and atrial fibrillation he lives with his son and most of the history was obtained by him as the patient was confused. He started vomiting on the day prior to admission before he went to bed. The morning of his admit when his son got up to the bathroom he found him on the ground unable to answer questions. His son related he usually uses a walker but he couldn't walk. He remained confused most of the day but had no fever chills or cough.  In the ED: After normal saline he became more responsive but was not making any sense. Chest x-ray showed L lower lobe infiltrate. Type of Study: Modified Barium Swallowing Study Reason for Referral: Objectively evaluate swallowing function Previous Swallow Assessment: none Diet Prior to this Study: NPO Temperature Spikes Noted: No Respiratory Status: Nasal cannula History of Recent  Intubation: No Behavior/Cognition: Alert;Cooperative;Requires cueing;Confused Oral Cavity - Dentition: Missing dentition;Poor condition Oral Motor / Sensory Function: Impaired motor;Impaired sensory Oral impairment: Left facial;Left labial Self-Feeding Abilities: Needs assist Patient Positioning: Upright in chair Baseline Vocal Quality: Clear Volitional Cough: Strong Volitional Swallow: Able to elicit Pharyngeal Secretions: Not observed secondary MBS    Reason for Referral Objectively evaluate swallowing function   Oral Phase Oral Preparation/Oral Phase Oral Phase: Impaired Oral - Honey Oral - Honey Teaspoon: Left anterior bolus loss;Weak lingual manipulation;Lingual pumping;Incomplete tongue to palate contact;Reduced posterior propulsion;Left pocketing in lateral sulci;Lingual/palatal residue;Delayed oral transit Oral - Nectar Oral - Nectar Teaspoon: Left anterior bolus loss;Weak lingual manipulation;Lingual pumping;Incomplete tongue to palate contact;Reduced posterior propulsion;Left pocketing in lateral sulci;Lingual/palatal residue;Delayed oral transit Oral - Nectar Cup: Left anterior bolus loss;Weak lingual manipulation;Lingual pumping;Incomplete tongue to palate contact;Reduced posterior propulsion;Left pocketing in lateral sulci;Lingual/palatal residue;Delayed oral transit Oral - Nectar Straw: Left anterior bolus loss;Weak lingual manipulation;Lingual pumping;Incomplete tongue to palate contact;Reduced posterior propulsion;Left pocketing in lateral sulci;Lingual/palatal residue;Delayed oral transit Oral - Thin Oral - Thin Teaspoon: Left anterior bolus loss;Weak lingual manipulation;Lingual pumping;Incomplete tongue to palate contact;Reduced posterior propulsion;Left pocketing in lateral sulci;Lingual/palatal residue;Delayed oral transit Oral - Solids Oral - Puree: Left anterior bolus loss;Weak lingual manipulation;Lingual pumping;Incomplete tongue to palate contact;Reduced posterior  propulsion;Left pocketing in lateral sulci;Lingual/palatal residue;Delayed oral transit   Pharyngeal Phase Pharyngeal Phase Pharyngeal Phase: Impaired Pharyngeal - Honey Pharyngeal - Honey Teaspoon: Delayed swallow initiation;Premature spillage to pyriform sinuses;Reduced pharyngeal peristalsis;Reduced epiglottic inversion;Reduced tongue base retraction;Penetration/Aspiration before swallow;Penetration/Aspiration after swallow;Moderate aspiration;Pharyngeal residue - valleculae Penetration/Aspiration details (honey teaspoon): Material enters airway, CONTACTS cords and not ejected out;Material enters airway, passes BELOW cords without attempt by patient to eject out (silent aspiration) Pharyngeal - Nectar Pharyngeal - Nectar Teaspoon: Delayed swallow initiation;Premature spillage to pyriform sinuses;Reduced pharyngeal peristalsis;Reduced epiglottic inversion;Reduced tongue base retraction;Penetration/Aspiration before swallow;Penetration/Aspiration after swallow;Moderate aspiration;Pharyngeal residue - valleculae Penetration/Aspiration details (nectar teaspoon): Material enters airway, CONTACTS cords and not ejected out;Material enters airway, passes BELOW cords without attempt by patient to eject out (silent aspiration) Pharyngeal - Nectar Cup: Delayed swallow initiation;Premature spillage to pyriform sinuses;Reduced pharyngeal peristalsis;Reduced epiglottic inversion;Reduced tongue base retraction;Penetration/Aspiration before swallow;Penetration/Aspiration after swallow;Moderate aspiration;Pharyngeal residue - valleculae Penetration/Aspiration details (nectar cup): Material enters airway, CONTACTS cords and not ejected out;Material enters airway, passes BELOW cords without attempt by patient to eject out (silent aspiration) Pharyngeal - Nectar Straw: Delayed swallow initiation;Premature spillage to pyriform sinuses;Reduced pharyngeal peristalsis;Reduced epiglottic inversion;Reduced tongue base  retraction;Penetration/Aspiration before swallow;Penetration/Aspiration after swallow;Moderate aspiration;Pharyngeal residue - valleculae Penetration/Aspiration details (nectar straw): Material enters airway, CONTACTS cords and not ejected out;Material enters airway, passes BELOW cords without attempt by patient to eject out (silent aspiration) Pharyngeal - Thin Pharyngeal - Thin Teaspoon: Delayed swallow initiation;Premature spillage to pyriform sinuses;Reduced pharyngeal peristalsis;Reduced epiglottic inversion;Reduced tongue base retraction;Penetration/Aspiration before swallow;Penetration/Aspiration after swallow;Moderate aspiration;Pharyngeal residue - valleculae Penetration/Aspiration details (thin teaspoon): Material enters airway, passes BELOW cords without attempt by patient to eject out (silent aspiration) Pharyngeal - Solids Pharyngeal - Puree: Premature spillage to valleculae;Delayed swallow initiation;Reduced epiglottic inversion;Reduced tongue base retraction;Reduced anterior laryngeal mobility;Reduced pharyngeal peristalsis;Penetration/Aspiration after swallow;Pharyngeal residue - valleculae Penetration/Aspiration details (puree): Material enters airway, remains ABOVE vocal cords  and not ejected out  Cervical Esophageal Phase    GO    Cervical Esophageal Phase Cervical Esophageal Phase: [facility]        Ronald DittyBonnie Vallen Calabrese, MA CCC-SLP [phone]  Ronald Prince 05/03/2013, 11:35 AM

## 2013-05-03 NOTE — Progress Notes (Signed)
Clinical Social Work Department BRIEF PSYCHOSOCIAL ASSESSMENT 05/03/2013  Patient:  Ronald Prince,Ronald Prince     Account Number:  [id number]     Admit date:  04/30/2013  Clinical Social Worker:  Varney BilesANDERSON,Tristyn Demarest, LCSWA  Date/Time:  05/02/2013 08:43 AM  Referred by:  Physician  Date Referred:  05/02/2013 Referred for  SNF Placement   Other Referral:   Interview type:  Family Other interview type:    PSYCHOSOCIAL DATA Living Status:  FAMILY Admitted from facility:   Level of care:   Primary support name:  [name] Bellizzi [phone] Primary support relationship to patient:  CHILD, ADULT Degree of support available:   Good--pt lives with son and grandchildren.    CURRENT CONCERNS Current Concerns  Post-Acute Placement   Other Concerns:    SOCIAL WORK ASSESSMENT / PLAN Called pt's son and explained CSW role; pt disoriented and has history of dementia. Son understanding of CSW role. CSW explained potential barrier for placement, as we need authorization from the state for placement and CSW has started the paperwork to get this. If this is not granted, pt's son understanding that at minimum medical team can arrange for home health. CSW to continue following up with son for care planning.   Assessment/plan status:  Psychosocial Support/Ongoing Assessment of Needs Other assessment/ plan:   Information/referral to community resources:   SNF?    PATIENT'S/FAMILY'S RESPONSE TO PLAN OF CARE: Good--pt's son understanding of CSW role and discharge planning. Provided contact information and invited son to call.       Maryclare LabradorJulie Mitesh Rosendahl, MSW, [facility] Clinical Social Worker [phone]

## 2013-05-03 NOTE — Progress Notes (Signed)
[place] TEAM 1 - Stepdown/ICU TEAM Progress Note  Ronald Prince MVH:[id number] DOB: [birth year] DOA: 04/30/2013 PCP: Elvina Sidle, MD    Admit HPI / Brief Narrative: 80 year old with past medical history of dementia and atrial fibrillation he lives with his son and most of the history was obtained by him as the patient was confused. He started vomiting on the day prior to admission before he went to bed. The morning of his admit when his son got up to the bathroom he found him on the ground unable to answer questions. His son related he usually uses a walker but he couldn't walk. He remained confused most of the day but had no fever chills or cough.   In the ED:  After normal saline he became more responsive but was not making any sense. An ABG was done that showed a pH of 7.28 PCO2 of 34 PO2 of 156 on BiPAP. CBC showed a white count of 13 with a left shift - chest x-ray showed L lower lobe infiltrate.  HPI/Subjective: Incomprehensible efforts at speech but seems more alert today- seemed to say hi to MD when he entered the room  Assessment/Plan:  Hypoxic Respiratory failure, acute / LLL CAP  Unclear if this represents community acquired pneumonia or possibly an aspiration event - continue empiric antibiotics and follow for clinical improvement  Sepsis (white blood cell 13.2, HR 96, CAP) Sepsis physiology is improving - continue supportive care-now hypertensive   AKI  Likely simple prerenal azotemia - Scr stable and BUN trends down- IVFs as above  Hypokalemia Add KCL to maintenance IVFs  Toxic metabolic encephalopathy in setting of chronic Dementia CT of head at admit revealed no acute findings - continue to treat acute illness and follow mental status-SLP eval today rec NPO pending MBSS-unable to participate with PT- family may not be able to care for pt in home at this point so as a precaution will ask SW to search for SNF and discuss home situation with the family  HTN -begin IV  Lopressor scheduled since NPO (was on Norvasc pre admit)  Possible UTI UA not normal - could be related to DH/CAP --urine cx sent 4/10  Possible early ileus Noted on CT abdom - follow clinical exam - n.p.o. for now regardless due to altered mental status  Nodules in B kidneys indeterminate via CT scan - consider [place] if w/u indicated after pt stabilizes further  Code Status: DO NOT RESUSCITATE Family Communication: no family present at time of exam Disposition Plan: Transfer to floor-may need SNF at discharge  Consultants: None  Procedures: None  Antibiotics: Azithromycin 4/7>> Cefepime 4/7 Ceftriaxone 4/7>> Flagyl 4/7 Vancomycin 4/7  DVT prophylaxis: SQ heparin  Objective: Blood pressure 163/87, pulse 76, temperature 97.8 F (36.6 C), temperature source Axillary, resp. rate 13, weight 147 lb 0.8 oz (66.7 kg), SpO2 100.00%.  Intake/Output Summary (Last 24 hours) at 05/03/13 1049 Last data filed at 05/03/13 0800  Gross per 24 hour  Intake   1450 ml  Output      0 ml  Net   1450 ml   Exam: General: No acute respiratory distress Lungs: CTA-  oxygen at 2L Cardiovascular: Regular rate and rhythm without murmur gallop or rub Abdomen: Nontender, nondistended, soft, bowel sounds positive, no rebound, no ascites, no appreciable mass Extremities: No significant cyanosis, clubbing, or edema bilateral lower extremities  Data Reviewed: Basic Metabolic Panel:  Recent Labs Lab 04/30/13 0800 05/01/13 0355 05/02/13 0330 05/03/13 0402  NA 145 143 144  142  K 3.6* 3.8 3.6* 3.4*  CL 100 110 111 106  CO2 20 22 21 22   GLUCOSE 204* 97 83 84  BUN 27* 38* 28* 19  CREATININE 1.38* 1.18 0.96 0.82  CALCIUM 9.1 8.4 8.3* 8.2*   Liver Function Tests:  Recent Labs Lab 05/01/13 0355  AST 25  ALT 16  ALKPHOS 51  BILITOT 0.4  PROT 5.4*  ALBUMIN 2.6*   CBC:  Recent Labs Lab 04/30/13 0800 05/01/13 0355 05/02/13 0330 05/03/13 0402  WBC 13.2* 11.8* 8.4 7.0  NEUTROABS  12.0*  --   --   --   HGB 12.6* 9.7* 8.9* 8.9*  HCT 38.0* 28.4* 26.3* 25.8*  MCV 101.3* 98.3 98.9 96.6  PLT 327 192 173 184   Cardiac Enzymes:  Recent Labs Lab 04/30/13 0800  TROPONINI <0.30   BNP (last 3 results)  Recent Labs  04/30/13 0800  PROBNP 523.6*    Recent Results (from the past 240 hour(s))  CULTURE, BLOOD (ROUTINE X 2)     Status: None   Collection Time    04/30/13  8:00 AM      Result Value Ref Range Status   Specimen Description BLOOD RIGHT ANTECUBITAL   Final   Special Requests BOTTLES DRAWN AEROBIC AND ANAEROBIC   Final   Culture  Setup Time     Final   Value: 04/30/2013 13:31     Performed at [facility]   Culture     Final   Value:        BLOOD CULTURE RECEIVED NO GROWTH TO DATE CULTURE WILL BE HELD FOR 5 DAYS BEFORE ISSUING A FINAL NEGATIVE REPORT     Performed at [facility]   Report Status PENDING   Incomplete  MRSA PCR SCREENING     Status: None   Collection Time    04/30/13  4:05 PM      Result Value Ref Range Status   MRSA by PCR NEGATIVE  NEGATIVE Final   Comment:            The GeneXpert MRSA Assay (FDA     approved for NASAL specimens     only), is one component of a     comprehensive MRSA colonization     surveillance program. It is not     intended to diagnose MRSA     infection nor to guide or     monitor treatment for     MRSA infections.  CULTURE, BLOOD (ROUTINE X 2)     Status: None   Collection Time    04/30/13  5:15 PM      Result Value Ref Range Status   Specimen Description BLOOD RIGHT HAND   Final   Special Requests BOTTLES DRAWN AEROBIC ONLY 5CC   Final   Culture  Setup Time     Final   Value: 04/30/2013 21:17     Performed at [facility]   Culture     Final   Value:        BLOOD CULTURE RECEIVED NO GROWTH TO DATE CULTURE WILL BE HELD FOR 5 DAYS BEFORE ISSUING A FINAL NEGATIVE REPORT     Performed at [facility]   Report Status PENDING   Incomplete     Studies:  Recent  x-ray studies have been reviewed in detail by the Attending Physician  Scheduled Meds:  Scheduled Meds: . azithromycin  500 mg Intravenous Q24H  . cefTRIAXone (ROCEPHIN)  IV  1 g Intravenous Q24H  . heparin  5,000 Units Subcutaneous 3 times per day  . sodium chloride  3 mL Intravenous Q12H    Time spent on care of this patient: 35 mins   Russella Darllison L Ellis ANP  [facility] Office  [phone] Pager - Text Page per Loretha StaplerAmion as per below:  On-Call/Text Page:      [email or web address]      password [email or web address]  If 7PM-7AM, please contact night-coverage [email or web address] Password [email or web address] 05/03/2013, 10:49 AM   LOS: 3 days     I have personally examined this patient and reviewed the entire database. I have reviewed the above note, made any necessary editorial changes, and agree with its content.  Assessment and plan as above, d/w patient his son risk of aspiration, treatment  Options;  -Patient is DNR no aggressive care, no PEG tube per patient, confirmed with his son;   -SNF 24-48 hrs  Esperanza SheetsUlugbek N Mikle Sternberg

## 2013-05-03 NOTE — Progress Notes (Signed)
PT Cancellation Note  Patient Details Name: Stephenie AcresCharles Dai MRN: [id number] DOB: 03-Sep-1917   Cancelled Treatment:    Reason Eval/Treat Not Completed: Patient at procedure or test/unavailable   Haeden Hudock B Atom Solivan 05/03/2013, 10:42 AM Delaney MeigsMaija Tabor Deion Swift, PT [phone]

## 2013-05-03 NOTE — Evaluation (Signed)
Clinical/Bedside Swallow Evaluation Patient Details  Name: Ronald Prince MRN: [id number] Date of Birth: 1917-05-30  Today's Date: 05/03/2013 Time: 0910-0940 SLP Time Calculation (min): 30 min  Past Medical History:  Past Medical History  Diagnosis Date  . Atrial fibrillation   . Dementia   . Hypertension   . Shortness of breath   . Pneumonia 04/2013  . GERD (gastroesophageal reflux disease)    Past Surgical History:  Past Surgical History  Procedure Laterality Date  . Cataract extraction     HPI:  80 year old with past medical history of dementia and atrial fibrillation he lives with his son and most of the history was obtained by him as the patient was confused. He started vomiting on the day prior to admission before he went to bed. The morning of his admit when his son got up to the bathroom he found him on the ground unable to answer questions. His son related he usually uses a walker but he couldn't walk. He remained confused most of the day but had no fever chills or cough. In the ED: After normal saline he became more responsive but was not making any sense. Chest x-ray showed L lower lobe infiltrate.   Assessment / Plan / Recommendation Clinical Impression  Pt with poor oral hygiene, SLP found excessive gobs of mucous in mouth with oral care. Pt demosntrates awareness of PO, but discordinated oral control and immediate and late evidence of aspriation with thin and nectar thick liquids. Pt with late wet vocal quality, suspect pharyngeal residuals. Pt is very dysarthric, son arrived and reported this has acutely worsened over the past few days but was not clear about what 'normal' is for this pt. Question left labial weakness. He does say his father has no signficant prior medical history. Will f/u with MBS today to determine if any POs are safe.     Aspiration Risk  Severe    Diet Recommendation NPO   Medication Administration: Via alternative means    Other  Recommendations  Recommended Consults: MBS Oral Care Recommendations: Oral care Q4 per protocol   Follow Up Recommendations       Frequency and Duration        Pertinent Vitals/Pain NA    SLP Swallow Goals     Swallow Study Prior Functional Status       General HPI: 80 year old with past medical history of dementia and atrial fibrillation he lives with his son and most of the history was obtained by him as the patient was confused. He started vomiting on the day prior to admission before he went to bed. The morning of his admit when his son got up to the bathroom he found him on the ground unable to answer questions. His son related he usually uses a walker but he couldn't walk. He remained confused most of the day but had no fever chills or cough. In the ED: After normal saline he became more responsive but was not making any sense. Chest x-ray showed L lower lobe infiltrate. Type of Study: Bedside swallow evaluation Previous Swallow Assessment: none Diet Prior to this Study: NPO Temperature Spikes Noted: No Respiratory Status: Nasal cannula History of Recent Intubation: No Behavior/Cognition: Alert;Cooperative;Requires cueing;Confused Oral Cavity - Dentition: Missing dentition;Poor condition Self-Feeding Abilities: Needs assist Patient Positioning: Upright in bed Baseline Vocal Quality: Clear Volitional Cough: Strong Volitional Swallow: Able to elicit    Oral/Motor/Sensory Function Overall Oral Motor/Sensory Function: Impaired Labial ROM: Reduced right;Reduced left Labial Symmetry: Abnormal symmetry left Labial  Strength: Reduced Lingual ROM: Within Functional Limits Lingual Symmetry: Within Functional Limits Lingual Strength: Reduced Facial Symmetry: Within Functional Limits   Ice Chips     Thin Liquid Thin Liquid: Impaired Presentation: Cup;Self Fed Oral Phase Impairments: Reduced labial seal Oral Phase Functional Implications: Left anterior spillage;Prolonged oral transit;Oral  holding Pharyngeal  Phase Impairments: Cough - Immediate;Cough - Delayed;Wet Vocal Quality;Multiple swallows;Suspected delayed Swallow;Decreased hyoid-laryngeal movement    Nectar Thick Nectar Thick Liquid: Impaired Presentation: Cup;Straw;Self Fed Oral Phase Impairments: Impaired anterior to posterior transit;Reduced labial seal;Reduced lingual movement/coordination Oral phase functional implications: Prolonged oral transit Pharyngeal Phase Impairments: Suspected delayed Swallow;Multiple swallows;Cough - Delayed;Wet Vocal Quality   Honey Thick Honey Thick Liquid: Not tested   Puree Puree: Impaired Presentation: Spoon Oral Phase Impairments: Impaired anterior to posterior transit Oral Phase Functional Implications: Prolonged oral transit Pharyngeal Phase Impairments: Suspected delayed Swallow   Solid   GO    Solid: Impaired Oral Phase Impairments: Impaired mastication;Impaired anterior to posterior transit;Reduced lingual movement/coordination Pharyngeal Phase Impairments: Cough - Immediate      Harlon DittyBonnie Sarayah Bacchi, MA CCC-SLP [phone]  Riley NearingBonnie Caroline Diala Waxman 05/03/2013,10:01 AM

## 2013-05-04 DIAGNOSIS — G929 Unspecified toxic encephalopathy: Secondary | ICD-10-CM

## 2013-05-04 DIAGNOSIS — N289 Disorder of kidney and ureter, unspecified: Secondary | ICD-10-CM

## 2013-05-04 DIAGNOSIS — E876 Hypokalemia: Secondary | ICD-10-CM

## 2013-05-04 DIAGNOSIS — G92 Toxic encephalopathy: Secondary | ICD-10-CM

## 2013-05-04 DIAGNOSIS — D649 Anemia, unspecified: Secondary | ICD-10-CM

## 2013-05-04 LAB — LEGIONELLA ANTIGEN, URINE: Legionella Antigen, Urine: NEGATIVE

## 2013-05-04 LAB — CBC WITH DIFFERENTIAL/PLATELET
Basophils Absolute: 0 10*3/uL (ref 0.0–0.1)
Basophils Relative: 0 % (ref 0–1)
Eosinophils Absolute: 0.1 10*3/uL (ref 0.0–0.7)
Eosinophils Relative: 2 % (ref 0–5)
HCT: 27.3 % — ABNORMAL LOW (ref 39.0–52.0)
HEMOGLOBIN: 9.6 g/dL — AB (ref 13.0–17.0)
LYMPHS ABS: 1.1 10*3/uL (ref 0.7–4.0)
Lymphocytes Relative: 24 % (ref 12–46)
MCH: 33.3 pg (ref 26.0–34.0)
MCHC: 35.2 g/dL (ref 30.0–36.0)
MCV: 94.8 fL (ref 78.0–100.0)
Monocytes Absolute: 0.4 10*3/uL (ref 0.1–1.0)
Monocytes Relative: 8 % (ref 3–12)
NEUTROS ABS: 3.1 10*3/uL (ref 1.7–7.7)
Neutrophils Relative %: 66 % (ref 43–77)
Platelets: 201 10*3/uL (ref 150–400)
RBC: 2.88 MIL/uL — AB (ref 4.22–5.81)
RDW: 12.5 % (ref 11.5–15.5)
WBC: 4.6 10*3/uL (ref 4.0–10.5)

## 2013-05-04 LAB — URINE CULTURE
COLONY COUNT: NO GROWTH
Culture: NO GROWTH

## 2013-05-04 LAB — COMPREHENSIVE METABOLIC PANEL
ALK PHOS: 57 U/L (ref 39–117)
ALT: 14 U/L (ref 0–53)
AST: 21 U/L (ref 0–37)
Albumin: 2.6 g/dL — ABNORMAL LOW (ref 3.5–5.2)
BUN: 12 mg/dL (ref 6–23)
CO2: 24 mEq/L (ref 19–32)
Calcium: 8.3 mg/dL — ABNORMAL LOW (ref 8.4–10.5)
Chloride: 100 mEq/L (ref 96–112)
Creatinine, Ser: 0.77 mg/dL (ref 0.50–1.35)
GFR, EST AFRICAN AMERICAN: 88 mL/min — AB (ref >90)
GFR, EST NON AFRICAN AMERICAN: 76 mL/min — AB (ref >90)
GLUCOSE: 125 mg/dL — AB (ref 70–99)
POTASSIUM: 3.5 meq/L — AB (ref 3.7–5.3)
Sodium: 136 mEq/L — ABNORMAL LOW (ref 137–147)
Total Bilirubin: 0.3 mg/dL (ref 0.3–1.2)
Total Protein: 5.5 g/dL — ABNORMAL LOW (ref 6.0–8.3)

## 2013-05-04 LAB — MAGNESIUM
MAGNESIUM: 1.8 mg/dL (ref 1.5–2.5)
Magnesium: 1.8 mg/dL (ref 1.5–2.5)

## 2013-05-04 MED ORDER — POTASSIUM CHLORIDE 10 MEQ/100ML IV SOLN: 10.0000 meq | INTRAVENOUS | Status: AC

## 2013-05-04 NOTE — Progress Notes (Signed)
ICU/STEP DOWN [facility] PROGRESS NOTE  Ronald Prince ZOX:[id number] DOB: 01-14-1918 DOA: 04/30/2013 PCP: Elvina Sidle, MD       Principal Problem:   Respiratory failure, acute Active Problems:   CAP (community acquired pneumonia)   Severe sepsis   AKI (acute kidney injury)   Toxic encephalopathy   Protein-calorie malnutrition, severe      VITAL SIGNS:  Temp: Pulse Rate:  Resp: BP: SpO2: FiO2 (%):   Ventilator settings N/A  Mode  Rate  Tidal Volume  FiO2  PO2/ FIO2  PIP  Plateau      Assessment/Plan: Neuro Toxic metabolic encephalopathy in setting of chronic Dementia -Leukocytosis has resolved cont. To monitor   Resp CAP -Continue azithromycin+ ceftriaxone for 7 day course  CVS  HTN -Continue metoprolol; BP control for a patient of 80 years old  GI Early ileus? -Continue patient n.p.o. secondary to possible early ileus and severe dysphagia -Will need to discuss with patient and family nutritional goals of care; Dobhoff tube placement?   Renal balance today; +250        /overall; +6491       Creatinine ; 0.77        Hourly output  Acute kidney injury -Resolved patient creatinine within normal limit  -Continue normal saline+ KCl at 53ml/hr -Monitor Electrolytes QD  Hypokalemia -See acute kidney injury  Nodules in bilateral kidneys -Will discuss ultrasound workup with family if patient stabilizes   Endocrine  Extremeties  Heme/labs Anemia -Will begin anemia workup -Stable continue to monitor daily  ID Severe sepsis; Resolved(  leukocytosis resolved, afebrile)  -04/30/2013 MRSA by PCR negative  -04/30/2013 Blood cultures negative  -05/03/2013 negative UTI? -Urine cultures have been negative to date and current antibiotics would cover the most likely organism    Code Status: DO NOT RESUSCITATE Family Communication: None Disposition Plan: Physical therapy recommends SNF    Devices   LINES / TUBES:         Consultants:     Procedures/SIGNIFICANT EVENTS: SLP evaluation 05/03/2013 -Recommend continue NPO. -Recommend modified barium swallow tests (MBS) which also showed severe dysphagia recommend NPO  CT head without contrast 04/30/2013 Atrophy and chronic microvascular ischemia. No acute abnormality  CT abdomen pelvis without contrast 04/30/2013 1. Dilated fluid-filled stomach and multiple fluid-filled loops of  small bowel. nonobstructive with no  an evolving ileus cannot be excluded.  2. Indeterminate nodules in the kidneys these findings may possibly be further characterized with ultrasound  3. Findings concerning for left lower lobe infiltrate. Component of atelectasis within the lung bases also a diagnostic consideration.  4. Small fat containing inguinal hernia on the left without further focal or acute abnormalities.     CULTURES:  -04/30/2013 MRSA by PCR negative -04/30/2013 Blood cultures negative -05/03/2013 negative    Antibiotics:  Azithromycin 4/7>>  Ceftriaxone 4/7>>  Cefepime 4/7 >> stopped 4/7 Flagyl 4/7 >> stopped 4/7 Vancomycin 4/7>> stopped 4/7      Continuous Infusions: . 0.9 % NaCl with KCl 20 mEq / L 1,000 mL (05/04/13 0714)      HPI/Subjective: 80 year old with past medical history of dementia and atrial fibrillation he lives with his son and most of the history was obtained by him as the patient was confused. He started vomiting on the day prior to admission before he went to bed. The morning of his admit when his son got up to the bathroom he found him on the ground unable to answer questions. His son related he usually uses a  walker but he couldn't walk. He remained confused most of the day but had no fever chills or cough.  In the ED: After normal saline he became more responsive but was not making any sense. An ABG was done that showed a pH of 7.28 PCO2 of 34 PO2 of 156 on BiPAP. CBC showed a white count of 13 with a left shift - chest x-ray showed  L lower lobe infiltrate. 4/11 A./O. x0 patient does attempt to communicate however does not make sense.    Exam:   General: A./O x0, NAD  Cardiovascular: Regular rhythm and rate, negative murmurs rubs or gallops  Respiratory: Clear to auscultation bilateral on 2 L O2 via [place]  Abdomen: Soft, nontender, nondistended plus bowel sound  Musculoskeletal: Negative pedal edema      Data Reviewed: Basic Metabolic Panel:  Recent Labs Lab 04/30/13 0800 05/01/13 0355 05/02/13 0330 05/03/13 0402  NA 145 143 144 142  K 3.6* 3.8 3.6* 3.4*  CL 100 110 111 106  CO2 20 22 21 22   GLUCOSE 204* 97 83 84  BUN 27* 38* 28* 19  CREATININE 1.38* 1.18 0.96 0.82  CALCIUM 9.1 8.4 8.3* 8.2*   Liver Function Tests:  Recent Labs Lab 05/01/13 0355  AST 25  ALT 16  ALKPHOS 51  BILITOT 0.4  PROT 5.4*  ALBUMIN 2.6*   No results found for this basename: LIPASE, AMYLASE,  in the last 168 hours No results found for this basename: AMMONIA,  in the last 168 hours CBC:  Recent Labs Lab 04/30/13 0800 05/01/13 0355 05/02/13 0330 05/03/13 0402  WBC 13.2* 11.8* 8.4 7.0  NEUTROABS 12.0*  --   --   --   HGB 12.6* 9.7* 8.9* 8.9*  HCT 38.0* 28.4* 26.3* 25.8*  MCV 101.3* 98.3 98.9 96.6  PLT 327 192 173 184   Cardiac Enzymes:  Recent Labs Lab 04/30/13 0800  TROPONINI <0.30   BNP (last 3 results)  Recent Labs  04/30/13 0800  PROBNP 523.6*   CBG: No results found for this basename: GLUCAP,  in the last 168 hours  Recent Results (from the past 240 hour(s))  CULTURE, BLOOD (ROUTINE X 2)     Status: None   Collection Time    04/30/13  8:00 AM      Result Value Ref Range Status   Specimen Description BLOOD RIGHT ANTECUBITAL   Final   Special Requests BOTTLES DRAWN AEROBIC AND ANAEROBIC 5MLS   Final   Culture  Setup Time     Final   Value: 04/30/2013 13:31     Performed at [facility]   Culture     Final   Value:        BLOOD CULTURE RECEIVED NO GROWTH TO DATE CULTURE WILL  BE HELD FOR 5 DAYS BEFORE ISSUING A FINAL NEGATIVE REPORT     Performed at [facility]   Report Status PENDING   Incomplete  MRSA PCR SCREENING     Status: None   Collection Time    04/30/13  4:05 PM      Result Value Ref Range Status   MRSA by PCR NEGATIVE  NEGATIVE Final   Comment:            The GeneXpert MRSA Assay (FDA     approved for NASAL specimens     only), is one component of a     comprehensive MRSA colonization     surveillance program. It is not  intended to diagnose MRSA     infection nor to guide or     monitor treatment for     MRSA infections.  CULTURE, BLOOD (ROUTINE X 2)     Status: None   Collection Time    04/30/13  5:15 PM      Result Value Ref Range Status   Specimen Description BLOOD RIGHT HAND   Final   Special Requests BOTTLES DRAWN AEROBIC ONLY 5CC   Final   Culture  Setup Time     Final   Value: 04/30/2013 21:17     Performed at [facility]   Culture     Final   Value:        BLOOD CULTURE RECEIVED NO GROWTH TO DATE CULTURE WILL BE HELD FOR 5 DAYS BEFORE ISSUING A FINAL NEGATIVE REPORT     Performed at [facility]   Report Status PENDING   Incomplete     Dg Swallowing Func-speech Pathology  05/03/2013   Riley Nearing Deblois, CCC-SLP     05/03/2013 11:39 AM Objective Swallowing Evaluation: Modified Barium Swallowing Study   Patient Details  Name: Ronald Prince MRN: [id number] Date of Birth: [birth year]  Today's Date: 05/03/2013 Time: 4098-1191 SLP Time Calculation (min): 17 min  Past Medical History:  Past Medical History  Diagnosis Date  . Atrial fibrillation   . Dementia   . Hypertension   . Shortness of breath   . Pneumonia 04/2013  . GERD (gastroesophageal reflux disease)    Past Surgical History:  Past Surgical History  Procedure Laterality Date  . Cataract extraction     HPI:  80 year old with past medical history of dementia and atrial  fibrillation he lives with his son and most of the history was  obtained by him as the  patient was confused. He started vomiting  on the day prior to admission before he went to bed. The morning  of his admit when his son got up to the bathroom he found him on  the ground unable to answer questions. His son related he usually  uses a walker but he couldn't walk. He remained confused most of  the day but had no fever chills or cough. In the ED: After normal  saline he became more responsive but was not making any sense.  Chest x-ray showed L lower lobe infiltrate. MD reports sepsis.     Assessment / Plan / Recommendation Clinical Impression  Dysphagia Diagnosis: Severe oral phase dysphagia;Severe  pharyngeal phase dysphagia Clinical impression: Pt demonstrates a severe oral dyspahgia with  weak lingual manipulation and pumping behavior to transit bolus,  leaving a moderate amount of oral residuals, mixing with  secretions in primarily left buccal cavity. There is premature  spillage to the valleculae with liquids pooling there (pt  slightly forward leaning during test, suspect he would  immediately aspirate liquids when lying slightly posteriorally in  bed).   With all liquid trials, despite thickness or bolus size pt has  some spillage into the airway before the swallow. Though airway  closure is adequate and penetrates are often expelled during the  swallow, signfiicant base of tongue weakness and hyolaryngeal  weakness lead to vallecular and pyriform sinus residuals that are  aspirated post swallow. Even purees pose high risk as residuals  mix with secretions. A chin tuck does decrease residue, but pt  would not be reliable.   Overall, pts dysphagia is severe and risk of aspiration with all  textures is high.  MD reports pt is not expected to have rapid  significant recovery of function, so discussion regarding goals  of care is warranted. For now pt should remain NPO, SLP will  follow for trials of puree with attempted compensatory  strategies.     Treatment Recommendation  Therapy as outlined in  treatment plan below    Diet Recommendation NPO   Medication Administration: Via alternative means    Other  Recommendations Recommended Consults: MBS Oral Care Recommendations: Oral care Q4 per protocol   Follow Up Recommendations       Frequency and Duration min 2x/week  2 weeks   Pertinent Vitals/Pain NA    SLP Swallow Goals     General HPI: 80 year old with past medical history of dementia  and atrial fibrillation he lives with his son and most of the  history was obtained by him as the patient was confused. He  started vomiting on the day prior to admission before he went to  bed. The morning of his admit when his son got up to the bathroom  he found him on the ground unable to answer questions. His son  related he usually uses a walker but he couldn't walk. He  remained confused most of the day but had no fever chills or  cough. In the ED: After normal saline he became more responsive  but was not making any sense. Chest x-ray showed L lower lobe  infiltrate. Type of Study: Modified Barium Swallowing Study Reason for Referral: Objectively evaluate swallowing function Previous Swallow Assessment: none Diet Prior to this Study: NPO Temperature Spikes Noted: No Respiratory Status: Nasal cannula History of Recent Intubation: No Behavior/Cognition: Alert;Cooperative;Requires cueing;Confused Oral Cavity - Dentition: Missing dentition;Poor condition Oral Motor / Sensory Function: Impaired motor;Impaired sensory Oral impairment: Left facial;Left labial Self-Feeding Abilities: Needs assist Patient Positioning: Upright in chair Baseline Vocal Quality: Clear Volitional Cough: Strong Volitional Swallow: Able to elicit Pharyngeal Secretions: Not observed secondary MBS    Reason for Referral Objectively evaluate swallowing function   Oral Phase Oral Preparation/Oral Phase Oral Phase: Impaired Oral - Honey Oral - Honey Teaspoon: Left anterior bolus loss;Weak lingual  manipulation;Lingual pumping;Incomplete tongue to palate   contact;Reduced posterior propulsion;Left pocketing in lateral  sulci;Lingual/palatal residue;Delayed oral transit Oral - Nectar Oral - Nectar Teaspoon: Left anterior bolus loss;Weak lingual  manipulation;Lingual pumping;Incomplete tongue to palate  contact;Reduced posterior propulsion;Left pocketing in lateral  sulci;Lingual/palatal residue;Delayed oral transit Oral - Nectar Cup: Left anterior bolus loss;Weak lingual  manipulation;Lingual pumping;Incomplete tongue to palate  contact;Reduced posterior propulsion;Left pocketing in lateral  sulci;Lingual/palatal residue;Delayed oral transit Oral - Nectar Straw: Left anterior bolus loss;Weak lingual  manipulation;Lingual pumping;Incomplete tongue to palate  contact;Reduced posterior propulsion;Left pocketing in lateral  sulci;Lingual/palatal residue;Delayed oral transit Oral - Thin Oral - Thin Teaspoon: Left anterior bolus loss;Weak lingual  manipulation;Lingual pumping;Incomplete tongue to palate  contact;Reduced posterior propulsion;Left pocketing in lateral  sulci;Lingual/palatal residue;Delayed oral transit Oral - Solids Oral - Puree: Left anterior bolus loss;Weak lingual  manipulation;Lingual pumping;Incomplete tongue to palate  contact;Reduced posterior propulsion;Left pocketing in lateral  sulci;Lingual/palatal residue;Delayed oral transit   Pharyngeal Phase Pharyngeal Phase Pharyngeal Phase: Impaired Pharyngeal - Honey Pharyngeal - Honey Teaspoon: Delayed swallow initiation;Premature  spillage to pyriform sinuses;Reduced pharyngeal  peristalsis;Reduced epiglottic inversion;Reduced tongue base  retraction;Penetration/Aspiration before  swallow;Penetration/Aspiration after swallow;Moderate  aspiration;Pharyngeal residue - valleculae Penetration/Aspiration details (honey teaspoon): Material enters  airway, CONTACTS cords and not ejected out;Material enters  airway, passes BELOW cords without attempt by patient to eject  out (  silent aspiration) Pharyngeal - Nectar  Pharyngeal - Nectar Teaspoon: Delayed swallow  initiation;Premature spillage to pyriform sinuses;Reduced  pharyngeal peristalsis;Reduced epiglottic inversion;Reduced  tongue base retraction;Penetration/Aspiration before  swallow;Penetration/Aspiration after swallow;Moderate  aspiration;Pharyngeal residue - valleculae Penetration/Aspiration details (nectar teaspoon): Material enters  airway, CONTACTS cords and not ejected out;Material enters  airway, passes BELOW cords without attempt by patient to eject  out (silent aspiration) Pharyngeal - Nectar Cup: Delayed swallow initiation;Premature  spillage to pyriform sinuses;Reduced pharyngeal  peristalsis;Reduced epiglottic inversion;Reduced tongue base  retraction;Penetration/Aspiration before  swallow;Penetration/Aspiration after swallow;Moderate  aspiration;Pharyngeal residue - valleculae Penetration/Aspiration details (nectar cup): Material enters  airway, CONTACTS cords and not ejected out;Material enters  airway, passes BELOW cords without attempt by patient to eject  out (silent aspiration) Pharyngeal - Nectar Straw: Delayed swallow initiation;Premature  spillage to pyriform sinuses;Reduced pharyngeal  peristalsis;Reduced epiglottic inversion;Reduced tongue base  retraction;Penetration/Aspiration before  swallow;Penetration/Aspiration after swallow;Moderate  aspiration;Pharyngeal residue - valleculae Penetration/Aspiration details (nectar straw): Material enters  airway, CONTACTS cords and not ejected out;Material enters  airway, passes BELOW cords without attempt by patient to eject  out (silent aspiration) Pharyngeal - Thin Pharyngeal - Thin Teaspoon: Delayed swallow initiation;Premature  spillage to pyriform sinuses;Reduced pharyngeal  peristalsis;Reduced epiglottic inversion;Reduced tongue base  retraction;Penetration/Aspiration before  swallow;Penetration/Aspiration after swallow;Moderate  aspiration;Pharyngeal residue - valleculae Penetration/Aspiration  details (thin teaspoon): Material enters  airway, passes BELOW cords without attempt by patient to eject  out (silent aspiration) Pharyngeal - Solids Pharyngeal - Puree: Premature spillage to valleculae;Delayed  swallow initiation;Reduced epiglottic inversion;Reduced tongue  base retraction;Reduced anterior laryngeal mobility;Reduced  pharyngeal peristalsis;Penetration/Aspiration after  swallow;Pharyngeal residue - valleculae Penetration/Aspiration details (puree): Material enters airway,  remains ABOVE vocal cords and not ejected out  Cervical Esophageal Phase    GO    Cervical Esophageal Phase Cervical Esophageal Phase: [facility]        Harlon Ditty, MA CCC-SLP [phone]  Riley Nearing Deblois 05/03/2013, 11:35 AM     Scheduled Meds: . azithromycin  500 mg Intravenous Q24H  . cefTRIAXone (ROCEPHIN)  IV  1 g Intravenous Q24H  . heparin  5,000 Units Subcutaneous 3 times per day  . metoprolol  5 mg Intravenous 4 times per day  . sodium chloride  3 mL Intravenous Q12H        Time spent:35min    Drema Dallas  [facility] Pager [phone]. If 7PM-7AM, please contact night-coverage at [email or web address], password [facility] 05/04/2013, 8:18 AM  LOS: 4 days

## 2013-05-05 ENCOUNTER — Inpatient Hospital Stay (HOSPITAL_COMMUNITY): Payer: Medicare Other

## 2013-05-05 DIAGNOSIS — K668 Other specified disorders of peritoneum: Secondary | ICD-10-CM | POA: Diagnosis present

## 2013-05-05 LAB — RETICULOCYTES
RBC.: 2.8 MIL/uL — AB (ref 4.22–5.81)
RETIC CT PCT: 1 % (ref 0.4–3.1)
Retic Count, Absolute: 28 10*3/uL (ref 19.0–186.0)

## 2013-05-05 MED ORDER — HEPARIN SODIUM (PORCINE) 5000 UNIT/ML IJ SOLN
5000.0000 [IU] | Freq: Three times a day (TID) | INTRAMUSCULAR | Status: DC
  Administered 2013-05-06 – 2013-05-10 (×12): 5000 [IU] via SUBCUTANEOUS
  Filled 2013-05-05 (×14): qty 1

## 2013-05-05 MED ORDER — POTASSIUM CHLORIDE 10 MEQ/100ML IV SOLN
10.0000 meq | INTRAVENOUS | Status: AC
  Administered 2013-05-05 (×2): 10 meq via INTRAVENOUS
  Filled 2013-05-05 (×3): qty 100

## 2013-05-05 MED ORDER — POTASSIUM CHLORIDE 10 MEQ/100ML IV SOLN
10.0000 meq | INTRAVENOUS | Status: AC
  Administered 2013-05-05 (×2): 10 meq via INTRAVENOUS

## 2013-05-05 NOTE — Progress Notes (Signed)
ICU/STEP DOWN [facility] PROGRESS NOTE  Ronald AcresCharles Prince QIO:[id number] DOB: 11-19-1917 DOA: 04/30/2013 PCP: Elvina SidleLAUENSTEIN,KURT, MD       Principal Problem:   Respiratory failure, acute Active Problems:   CAP (community acquired pneumonia)   Severe sepsis   AKI (acute kidney injury)   Toxic encephalopathy   Protein-calorie malnutrition, severe   Anemia   Pneumoperitoneum      VITAL SIGNS:  Temp: 36.4 Pulse Rate: 72 Resp: 25 BP: 165/97 SpO2: 93% on 2 L O2 FiO2 (%):   Ventilator settings N/A  Mode  Rate  Tidal Volume  FiO2  PO2/ FIO2  PIP  Plateau      Assessment/Plan: Neuro Toxic metabolic encephalopathy in setting of chronic Dementia -Leukocytosis has resolved cont. To monitor -4/12 patient's baseline mental status is ability to communicate with family, and perform his daily ADLs per her son Mr. Ronald Prince. No improvement with resolution of leukocytosis and electrolyte abnormalities we will obtain MRI/MRA brain   Resp CAP -Continue azithromycin+ ceftriaxone for 7 day course (stop date 4/14)  Pneumoperitoneum -Patient's O2 demand has decreased since admission, will obtain PCXR ensure resolution; if has enlarged or has not resolved consider surgical consultation  CVS  HTN -Continue metoprolol; BP control for a patient of 80 years old  GI Early ileus? -Continue patient n.p.o. secondary to possible early ileus and severe dysphagia -Will need to discuss with patient and family nutritional goals of care;  -4/12 spoke with Mr. Ronald Prince (son) and he has agreed to placement of Dobhoff tube. Will schedule for IR placement.   Renal balance today; +650        /overall; +8320       Creatinine ; 0.77        Hourly output  Acute kidney injury -Resolved patient creatinine within normal limit  -Continue normal saline+ KCl 20meq at 5570ml/hr -Monitor Electrolytes QD  Hypokalemia -Patient's potassium trending down; replete with potassium IV 10 meq x  3 -Continue normal saline + KCl 220meq/L at 75ml hr -See acute kidney injury  Nodules in bilateral kidneys -Will discuss ultrasound workup with family if patient stabilizes   Endocrine  Extremeties  Heme/labs Anemia -Anemia panel, methylmalonic acid, haptoglobin pending -Stable continue to monitor daily  ID Severe sepsis; Resolved(  leukocytosis resolved, afebrile)  -04/30/2013 MRSA by PCR negative  -04/30/2013 Blood cultures negative  -05/03/2013 urine culture negative       Code Status: DO NOT RESUSCITATE Family Communication: 4/12 spoke by phone with Ronald Prince (son), who agreed to placement of Dobhoff tube and MRI/MRA of brain  Disposition Plan: Physical therapy recommends SNF    Devices   LINES / TUBES:  20-gauge IV right forearm placed 4/8>>      Consultants:     Procedures/SIGNIFICANT EVENTS: SLP evaluation 05/03/2013 -Recommend continue NPO. -Recommend modified barium swallow tests (MBS) which also showed severe dysphagia recommend NPO   CT head without contrast 04/30/2013 Atrophy and chronic microvascular ischemia. No acute abnormality  CT abdomen pelvis without contrast 04/30/2013 1. Dilated fluid-filled stomach and multiple fluid-filled loops of  small bowel. nonobstructive with no  an evolving ileus cannot be excluded.  2. Indeterminate nodules in the kidneys these findings may possibly be further characterized with ultrasound  3. Findings concerning for left lower lobe infiltrate. Component of atelectasis within the lung bases also a diagnostic consideration.  4. Small fat containing inguinal hernia on the left without further focal or acute abnormalities.  PCXR 04/30/2013 1. Pneumoperitoneum.  2. Left lower  lobe pneumonia.     CULTURES:  -04/30/2013 MRSA by PCR negative -04/30/2013 Blood cultures negative -05/03/2013 urine culture negative    Antibiotics: Azithromycin 4/7>>  Ceftriaxone 4/7>>  Cefepime 4/7 >> stopped 4/7 Flagyl 4/7 >>  stopped 4/7 Vancomycin 4/7>> stopped 4/7      Continuous Infusions: . 0.9 % NaCl with KCl 20 mEq / L 75 mL/hr at 05/05/13 0054      HPI/Subjective: 80 year old with past medical history of dementia and atrial fibrillation he lives with his son and most of the history was obtained by him as the patient was confused. He started vomiting on the day prior to admission before he went to bed. The morning of his admit when his son got up to the bathroom he found him on the ground unable to answer questions. His son related he usually uses a walker but he couldn't walk. He remained confused most of the day but had no fever chills or cough.  In the ED: After normal saline he became more responsive but was not making any sense. An ABG was done that showed a pH of 7.28 PCO2 of 34 PO2 of 156 on BiPAP. CBC showed a white count of 13 with a left shift - chest x-ray showed L lower lobe infiltrate. 4/11 A./O. x0 patient does attempt to communicate however does not make sense. 4/12 patient somewhat more animated today still attempting to talk, however still unintelligible. Does not follow commands.    Exam:   General: A./O x0, NAD, mumbles unintelligible sounds.  Cardiovascular: Regular rhythm and rate, negative murmurs rubs or gallops  Respiratory: Clear to auscultation bilateral on 2 L O2 via [place]  Abdomen: Soft, nontender, nondistended plus bowel sound  Musculoskeletal: Negative pedal edema      Data Reviewed: Basic Metabolic Panel:  Recent Labs Lab 04/30/13 0800 05/01/13 0355 05/02/13 0330 05/03/13 0402 05/04/13 1257  NA 145 143 144 142 136*  K 3.6* 3.8 3.6* 3.4* 3.5*  CL 100 110 111 106 100  CO2 20 22 21 22 24   GLUCOSE 204* 97 83 84 125*  BUN 27* 38* 28* 19 12  CREATININE 1.38* 1.18 0.96 0.82 0.77  CALCIUM 9.1 8.4 8.3* 8.2* 8.3*  MG  --   --   --   --  1.8  1.8   Liver Function Tests:  Recent Labs Lab 05/01/13 0355 05/04/13 1257  AST 25 21  ALT 16 14  ALKPHOS 51 57   BILITOT 0.4 0.3  PROT 5.4* 5.5*  ALBUMIN 2.6* 2.6*   No results found for this basename: LIPASE, AMYLASE,  in the last 168 hours No results found for this basename: AMMONIA,  in the last 168 hours CBC:  Recent Labs Lab 04/30/13 0800 05/01/13 0355 05/02/13 0330 05/03/13 0402 05/04/13 1257  WBC 13.2* 11.8* 8.4 7.0 4.6  NEUTROABS 12.0*  --   --   --  3.1  HGB 12.6* 9.7* 8.9* 8.9* 9.6*  HCT 38.0* 28.4* 26.3* 25.8* 27.3*  MCV 101.3* 98.3 98.9 96.6 94.8  PLT 327 192 173 184 201   Cardiac Enzymes:  Recent Labs Lab 04/30/13 0800  TROPONINI <0.30   BNP (last 3 results)  Recent Labs  04/30/13 0800  PROBNP 523.6*   CBG: No results found for this basename: GLUCAP,  in the last 168 hours  Recent Results (from the past 240 hour(s))  CULTURE, BLOOD (ROUTINE X 2)     Status: None   Collection Time    04/30/13  8:00  AM      Result Value Ref Range Status   Specimen Description BLOOD RIGHT ANTECUBITAL   Final   Special Requests BOTTLES DRAWN AEROBIC AND ANAEROBIC   Final   Culture  Setup Time     Final   Value: 04/30/2013 13:31     Performed at [facility]   Culture     Final   Value:        BLOOD CULTURE RECEIVED NO GROWTH TO DATE CULTURE WILL BE HELD FOR 5 DAYS BEFORE ISSUING A FINAL NEGATIVE REPORT     Performed at [facility]   Report Status PENDING   Incomplete  MRSA PCR SCREENING     Status: None   Collection Time    04/30/13  4:05 PM      Result Value Ref Range Status   MRSA by PCR NEGATIVE  NEGATIVE Final   Comment:            The GeneXpert MRSA Assay (FDA     approved for NASAL specimens     only), is one component of a     comprehensive MRSA colonization     surveillance program. It is not     intended to diagnose MRSA     infection nor to guide or     monitor treatment for     MRSA infections.  CULTURE, BLOOD (ROUTINE X 2)     Status: None   Collection Time    04/30/13  5:15 PM      Result Value Ref Range Status   Specimen  Description BLOOD RIGHT HAND   Final   Special Requests BOTTLES DRAWN AEROBIC ONLY 5CC   Final   Culture  Setup Time     Final   Value: 04/30/2013 21:17     Performed at [facility]   Culture     Final   Value:        BLOOD CULTURE RECEIVED NO GROWTH TO DATE CULTURE WILL BE HELD FOR 5 DAYS BEFORE ISSUING A FINAL NEGATIVE REPORT     Performed at [facility]   Report Status PENDING   Incomplete  URINE CULTURE     Status: None   Collection Time    05/03/13  8:34 AM      Result Value Ref Range Status   Specimen Description URINE, RANDOM   Final   Special Requests NONE   Final   Culture  Setup Time     Final   Value: 05/03/2013 09:18     Performed at [facility] Count     Final   Value: NO GROWTH     Performed at [facility]   Culture     Final   Value: NO GROWTH     Performed at [facility]   Report Status 05/04/2013 FINAL   Final     No results found.  Scheduled Meds: . azithromycin  500 mg Intravenous Q24H  . cefTRIAXone (ROCEPHIN)  IV  1 g Intravenous Q24H  . heparin  5,000 Units Subcutaneous 3 times per day  . metoprolol  5 mg Intravenous 4 times per day  . potassium chloride  10 mEq Intravenous Q1 Hr x 3  . sodium chloride  3 mL Intravenous Q12H        Time spent:57min    Drema Dallas  [facility] Pager [phone]. If 7PM-7AM, please contact night-coverage at [email or web address], password [facility] 05/05/2013,  7:37 PM  LOS: 5 days

## 2013-05-06 ENCOUNTER — Inpatient Hospital Stay (HOSPITAL_COMMUNITY): Payer: Medicare Other

## 2013-05-06 LAB — IRON AND TIBC
Iron: 31 ug/dL — ABNORMAL LOW (ref 42–135)
Saturation Ratios: 20 % (ref 20–55)
TIBC: 154 ug/dL — ABNORMAL LOW (ref 215–435)
UIBC: 123 ug/dL — ABNORMAL LOW (ref 125–400)

## 2013-05-06 LAB — CULTURE, BLOOD (ROUTINE X 2)
CULTURE: NO GROWTH
Culture: NO GROWTH

## 2013-05-06 LAB — VITAMIN B12: Vitamin B-12: 936 pg/mL — ABNORMAL HIGH (ref 211–911)

## 2013-05-06 LAB — FOLATE: Folate: >20 ng/mL

## 2013-05-06 LAB — HAPTOGLOBIN: Haptoglobin: 104 mg/dL (ref 45–215)

## 2013-05-06 LAB — FERRITIN: FERRITIN: 246 ng/mL (ref 22–322)

## 2013-05-06 MED ORDER — IOHEXOL 300 MG/ML  SOLN
50.0000 mL | Freq: Once | INTRAMUSCULAR | Status: AC | PRN
  Administered 2013-05-06: 50 mL

## 2013-05-06 MED ORDER — FERROUS SULFATE 220 (44 FE) MG/5ML PO ELIX
220.0000 mg | ORAL_SOLUTION | Freq: Two times a day (BID) | ORAL | Status: DC
  Administered 2013-05-06: 220 mg
  Filled 2013-05-06 (×4): qty 5

## 2013-05-06 NOTE — Progress Notes (Signed)
[place] TEAM 1 - Stepdown/ICU TEAM Progress Note  Ronald AcresCharles Prince ZOX:[id number] DOB: 1917/07/15 DOA: 04/30/2013 PCP: Elvina SidleLAUENSTEIN,KURT, MD    Admit HPI / Brief Narrative: 80 year old with past medical history of dementia and atrial fibrillation he lives with his son. He started vomiting on the day prior to admission before he went to bed. The morning of his admit when his son got up he found him on the floor unable to answer questions. His son related he usually uses a walker but he couldn't walk. He remained confused most of the day but had no fever chills or cough.   In the ED:  After normal saline he became more responsive but was not making any sense. An ABG was done that showed a pH of 7.28 PCO2 of 34 PO2 of 156 on BiPAP. CBC showed a white count of 13 with a left shift - chest x-ray showed L lower lobe infiltrate.  HPI/Subjective: Incomprehensible efforts at speech but seems more alert today- made eye contact with staff when awakened.  Assessment/Plan:  Hypoxic Respiratory failure, acute / LLL CAP  -Unclear if this represents community acquired pneumonia or possibly an aspiration event - continue empiric antibiotics and follow for clinical improvement - stop date for anbx's 4/14  Sepsis (white blood cell 13.2, HR 96, CAP) Sepsis physiology resolved - continue supportive care  AKI  -Likely simple prerenal azotemia - Scr stable and BUN trend down - IVFs as above - labs in am  Hypokalemia -prn KCL per tube  Toxic metabolic encephalopathy in setting of chronic Dementia -CT of head at admit revealed no acute findings - continue to treat acute illness and follow mental status - SLP eval rec NPO - previous MD d/w son and OK with ST tube feeding but no PEG - pulled out NG today and RN unable to re insert so sent to XR- cont ST NG feeds to see if improves - MRI 4/12 negative for cause of persistent AMS - Dr. Joseph ArtWoods d/w son Ronald Prince(Ronald Prince) and baseline MS pre admit was ability to communicate with  family, and perform his daily ADLs   HTN -cont IV Lopressor scheduled since NPO (was on Norvasc pre admit)  Iron def anemia -replete iron per tube  Possible UTI -UA not normal - could be related to DH/CAP - urine cx 4/10 no growth  Possible early ileus/?? pneumoperitoneum -? Of free air on initial CXR but CT done in ED confirmed NO FREE AIR- has tolerated TF thus far but watch for recurrence of ileus sx's  Nodules in B kidneys -indeterminate via CT scan - given advanced age would NOT pursue further work up  Code Status: DO NOT RESUSCITATE Family Communication: no family present at time of exam Disposition Plan: Floor - SNF at discharge  Consultants: None  Procedures: None  Antibiotics: Azithromycin 4/7>> Cefepime 4/7 Ceftriaxone 4/7>> Flagyl 4/7 Vancomycin 4/7  DVT prophylaxis: SQ heparin  Objective: Blood pressure 170/88, pulse 68, temperature 98.1 F (36.7 C), temperature source Oral, resp. rate 16, height 5\' 8"  (1.727 m), weight 145 lb 11.6 oz (66.1 kg), SpO2 95.00%.  Intake/Output Summary (Last 24 hours) at 05/06/13 1546 Last data filed at 05/06/13 0550  Gross per 24 hour  Intake      0 ml  Output   1400 ml  Net  -1400 ml   Exam: General: No acute respiratory distress Lungs: CTA -  oxygen at 2L Cardiovascular: Regular rate and rhythm without murmur gallop or rub Abdomen: Nontender, nondistended, soft,  bowel sounds positive, no rebound, no ascites, no appreciable mass Extremities: No significant cyanosis, clubbing, or edema bilateral lower extremities  Data Reviewed: Basic Metabolic Panel:  Recent Labs Lab 04/30/13 0800 05/01/13 0355 05/02/13 0330 05/03/13 0402 05/04/13 1257  NA 145 143 144 142 136*  K 3.6* 3.8 3.6* 3.4* 3.5*  CL 100 110 111 106 100  CO2 20 22 21 22 24   GLUCOSE 204* 97 83 84 125*  BUN 27* 38* 28* 19 12  CREATININE 1.38* 1.18 0.96 0.82 0.77  CALCIUM 9.1 8.4 8.3* 8.2* 8.3*  MG  --   --   --   --  1.8  1.8   Liver Function  Tests:  Recent Labs Lab 05/01/13 0355 05/04/13 1257  AST 25 21  ALT 16 14  ALKPHOS 51 57  BILITOT 0.4 0.3  PROT 5.4* 5.5*  ALBUMIN 2.6* 2.6*   CBC:  Recent Labs Lab 04/30/13 0800 05/01/13 0355 05/02/13 0330 05/03/13 0402 05/04/13 1257  WBC 13.2* 11.8* 8.4 7.0 4.6  NEUTROABS 12.0*  --   --   --  3.1  HGB 12.6* 9.7* 8.9* 8.9* 9.6*  HCT 38.0* 28.4* 26.3* 25.8* 27.3*  MCV 101.3* 98.3 98.9 96.6 94.8  PLT 327 192 173 184 201   Cardiac Enzymes:  Recent Labs Lab 04/30/13 0800  TROPONINI <0.30   BNP (last 3 results)  Recent Labs  04/30/13 0800  PROBNP 523.6*    Recent Results (from the past 240 hour(s))  CULTURE, BLOOD (ROUTINE X 2)     Status: None   Collection Time    04/30/13  8:00 AM      Result Value Ref Range Status   Specimen Description BLOOD RIGHT ANTECUBITAL   Final   Special Requests BOTTLES DRAWN AEROBIC AND ANAEROBIC   Final   Culture  Setup Time     Final   Value: 04/30/2013 13:31     Performed at [facility]   Culture     Final   Value: NO GROWTH 5 DAYS     Performed at [facility]   Report Status 05/06/2013 FINAL   Final  MRSA PCR SCREENING     Status: None   Collection Time    04/30/13  4:05 PM      Result Value Ref Range Status   MRSA by PCR NEGATIVE  NEGATIVE Final   Comment:            The GeneXpert MRSA Assay (FDA     approved for NASAL specimens     only), is one component of a     comprehensive MRSA colonization     surveillance program. It is not     intended to diagnose MRSA     infection nor to guide or     monitor treatment for     MRSA infections.  CULTURE, BLOOD (ROUTINE X 2)     Status: None   Collection Time    04/30/13  5:15 PM      Result Value Ref Range Status   Specimen Description BLOOD RIGHT HAND   Final   Special Requests BOTTLES DRAWN AEROBIC ONLY 5CC   Final   Culture  Setup Time     Final   Value: 04/30/2013 21:17     Performed at [facility]   Culture     Final   Value:  NO GROWTH 5 DAYS     Performed at [facility]   Report Status  05/06/2013 FINAL   Final  URINE CULTURE     Status: None   Collection Time    05/03/13  8:34 AM      Result Value Ref Range Status   Specimen Description URINE, RANDOM   Final   Special Requests NONE   Final   Culture  Setup Time     Final   Value: 05/03/2013 09:18     [facility]   [place] Count     Final   Value: NO GROWTH     [facility]   Culture     Final   Value: NO GROWTH     [facility] at [facility]   Report Status 05/04/2013 FINAL   Final     Studies:  Recent x-ray studies have been reviewed in detail by the Attending Physician  Scheduled Meds:  Scheduled Meds: . azithromycin  500 mg Intravenous Q24H  . cefTRIAXone (ROCEPHIN)  IV  1 g Intravenous Q24H  . heparin  5,000 Units Subcutaneous 3 times per day  . metoprolol  5 mg Intravenous 4 times per day  . sodium chloride  3 mL Intravenous Q12H    Time spent on care of this patient: 35 mins  Russella Darllison L Ellis ANP  [facility] Office  [phone] Pager - Text Page per Loretha StaplerAmion as per below:  On-Call/Text Page:      [email or web address]      password [email or web address]  If 7PM-7AM, please contact night-coverage [email or web address] Password [email or web address] 05/06/2013, 3:46 PM   LOS: 6 days   I have personally examined this patient and reviewed the entire database. I have reviewed the above note, made any necessary editorial changes, and agree with its content.  Lonia BloodJeffrey T. McClung, MD [facility]

## 2013-05-06 NOTE — Progress Notes (Signed)
SLP Cancellation Note  Patient Details Name: Ronald Prince MRN: [id number] DOB: 09-29-1917   Cancelled treatment:        Attempted to see patient for trials with po's.  Currently pt is off the floor for MRI.  MC notes also report possible early ileus.  Will defer swallow tx. Today.   Lenor DerrickLori T Kirt Chew 05/06/2013, 11:31 AM

## 2013-05-06 NOTE — Progress Notes (Signed)
Signed FL2 placed back on chart; pt transferred to 6N. Unit CSW provided a handoff. This CSW signing off.   Maryclare LabradorJulie Hines Kloss, MSW, [facility] LLCCSWA Clinical Social Worker [phone]

## 2013-05-06 NOTE — Progress Notes (Signed)
INITIAL NUTRITION ASSESSMENT  DOCUMENTATION CODES Per approved criteria  -Not Applicable   INTERVENTION: TF recommendations: Recommend initiating Jevity 1.2 @ 20 ml/hr via NGT and increase by 10 ml every 4 hours to goal rate of 60 ml/hr.  At goal rate, tube feeding regimen will provide 1728 kcal, 80 grams of protein, and 1162 ml of H2O. Closely monitor residuals and bowel function for TF tolerance (? Ileus) Once continuous infusions are discontinued, recommend provided 130 ml free water flushes QID to provide an additional 520 ml of water (total of 1682 ml free water daily). RD to continue to monitor  NUTRITION DIAGNOSIS: Inadequate oral intake related to inability to eat as evidenced by NPO status and NGT tube placement.   Goal: Pt to meet >/= 90% of their estimated nutrition needs   Monitor:  TF initiation/rate, TF tolerance, weight trend, labs  Reason for Assessment: Low Braden  80 y.o. male  Admitting Dx: Respiratory failure, acute  ASSESSMENT: 80 year old with past medical history of dementia and atrial fibrillation he lives with his son and most of the history was obtained by him as the patient is confused. He started on the day prior to admission before he went to bed that he started vomiting. His morning when his son got up to the bathroom and found him on the ground unable to answer questions. His son relates is usually uses a walker but he couldn't walk.  LOS 6 Pt with possible early ileus per chart.  Pt brought to RD attention due to low braden score. Per chart, pt's son has agreed to dobhoff tube. Per RN pt had pulled tube but, it has recently been replaced. Per weight history, appears pt's weight has been fairly stable with 5 lb weight loss in 9 months.  Pt was NPO upon admission 4/7. Diet advanced to Dysphagia 1 diet on 4/10 but, canceled 4/11. Per pt's son at bedside pt was eating well PTA and has been maintaining his weight. Pt appears well nourished- no signs of  wasting in arms, shoulders, or legs.   Height: Ht Readings from Last 1 Encounters:  05/04/13 5\' 8"  (1.727 m)    Weight: Wt Readings from Last 1 Encounters:  05/04/13 145 lb 11.6 oz (66.1 kg)    Ideal Body Weight: 154 lbs  % Ideal Body Weight: 94%  Wt Readings from Last 10 Encounters:  05/04/13 145 lb 11.6 oz (66.1 kg)  07/26/12 150 lb (68.04 kg)  08/04/11 152 lb (68.947 kg)  admission weight 147 lb (04/30/13)  Usual Body Weight: 150 lbs  % Usual Body Weight: 97%  BMI:  Body mass index is 22.16 kg/(m^2).  Estimated Nutritional Needs: Kcal: 1500-1700 Protein: 75-90 grams Fluid: 1.5-1.7 L/day  Skin: intact  Diet Order:   NPO  EDUCATION NEEDS: -No education needs identified at this time   Intake/Output Summary (Last 24 hours) at 05/06/13 1412 Last data filed at 05/06/13 0550  Gross per 24 hour  Intake      0 ml  Output   1400 ml  Net  -1400 ml    Last BM: 4/11   Labs:   Recent Labs Lab 05/02/13 0330 05/03/13 0402 05/04/13 1257  NA 144 142 136*  K 3.6* 3.4* 3.5*  CL 111 106 100  CO2 21 22 24   BUN 28* 19 12  CREATININE 0.96 0.82 0.77  CALCIUM 8.3* 8.2* 8.3*  MG  --   --  1.8  1.8  GLUCOSE 83 84 125*    CBG (  last 3)  No results found for this basename: GLUCAP,  in the last 72 hours  Scheduled Meds: . azithromycin  500 mg Intravenous Q24H  . cefTRIAXone (ROCEPHIN)  IV  1 g Intravenous Q24H  . heparin  5,000 Units Subcutaneous 3 times per day  . metoprolol  5 mg Intravenous 4 times per day  . sodium chloride  3 mL Intravenous Q12H    Continuous Infusions: . 0.9 % NaCl with KCl 20 mEq / L 75 mL/hr at 05/05/13 0054    Past Medical History  Diagnosis Date  . Atrial fibrillation   . Dementia   . Hypertension   . Shortness of breath   . Pneumonia 04/2013  . GERD (gastroesophageal reflux disease)     Past Surgical History  Procedure Laterality Date  . Cataract extraction      Ian Malkineanne Barnett RD, LDN Inpatient Clinical  Dietitian Pager: [phone] After Hours Pager: [phone]

## 2013-05-07 DIAGNOSIS — E43 Unspecified severe protein-calorie malnutrition: Secondary | ICD-10-CM

## 2013-05-07 LAB — CBC
HCT: 28.4 % — ABNORMAL LOW (ref 39.0–52.0)
Hemoglobin: 10 g/dL — ABNORMAL LOW (ref 13.0–17.0)
MCH: 33.2 pg (ref 26.0–34.0)
MCHC: 35.2 g/dL (ref 30.0–36.0)
MCV: 94.4 fL (ref 78.0–100.0)
Platelets: 245 10*3/uL (ref 150–400)
RBC: 3.01 MIL/uL — ABNORMAL LOW (ref 4.22–5.81)
RDW: 12.5 % (ref 11.5–15.5)
WBC: 6.9 10*3/uL (ref 4.0–10.5)

## 2013-05-07 LAB — PHOSPHORUS: Phosphorus: 2.7 mg/dL (ref 2.3–4.6)

## 2013-05-07 LAB — GLUCOSE, CAPILLARY: GLUCOSE-CAPILLARY: 119 mg/dL — AB (ref 70–99)

## 2013-05-07 LAB — BASIC METABOLIC PANEL
BUN: 8 mg/dL (ref 6–23)
CO2: 21 meq/L (ref 19–32)
CREATININE: 0.72 mg/dL (ref 0.50–1.35)
Calcium: 8.3 mg/dL — ABNORMAL LOW (ref 8.4–10.5)
Chloride: 103 mEq/L (ref 96–112)
GFR calc Af Amer: >90 mL/min (ref >90)
GFR calc non Af Amer: 78 mL/min — ABNORMAL LOW (ref >90)
GLUCOSE: 89 mg/dL (ref 70–99)
Potassium: 3.9 mEq/L (ref 3.7–5.3)
Sodium: 138 mEq/L (ref 137–147)

## 2013-05-07 MED ORDER — FLEET ENEMA 7-19 GM/118ML RE ENEM
1.0000 | ENEMA | Freq: Every day | RECTAL | Status: DC | PRN
  Filled 2013-05-07: qty 1

## 2013-05-07 MED ORDER — FERROUS SULFATE 300 (60 FE) MG/5ML PO SYRP
300.0000 mg | ORAL_SOLUTION | Freq: Two times a day (BID) | ORAL | Status: DC
  Administered 2013-05-07 – 2013-05-10 (×7): 300 mg
  Filled 2013-05-07 (×9): qty 5

## 2013-05-07 MED ORDER — JEVITY 1.2 CAL PO LIQD
1000.0000 mL | ORAL | Status: DC
  Filled 2013-05-07 (×2): qty 1000

## 2013-05-07 MED ORDER — JEVITY 1.2 CAL PO LIQD
1000.0000 mL | ORAL | Status: DC
  Administered 2013-05-07: 1000 mL
  Administered 2013-05-08: 60 mL/h
  Administered 2013-05-09: 1000 mL
  Administered 2013-05-10: 01:00:00
  Filled 2013-05-07 (×8): qty 1000

## 2013-05-07 MED ORDER — POLYETHYLENE GLYCOL 3350 17 G PO PACK
17.0000 g | PACK | Freq: Every day | ORAL | Status: DC
  Administered 2013-05-07 – 2013-05-09 (×3): 17 g via ORAL
  Filled 2013-05-07 (×4): qty 1

## 2013-05-07 NOTE — Progress Notes (Signed)
Speech Language Pathology Treatment: Dysphagia  Patient Details Name: Ronald Prince MRN: [id number] DOB: 11-05-1917 Today's Date: 05/07/2013 Time: [id number] SLP Time Calculation (min): 25 min  Assessment / Plan / Recommendation Clinical Impression  Pt continues to be severely confused, unable to follow cues for very basic strategies to clear airway (second swallow, cough). Attempted to give pt trials of puree with max cues for a chin tuck, unsuccessful. Pt consumed 5 bites followed by wet vocal quality indicative of significant pharyngeal residuals, consistent with findings on MBS. Suspect chronic dysphagia exacerbated by weakness and AMS. Anticipate high risk of aspiration with all POs into the long term based on severity seen on MBS, lack of progress and dementia. Suggest consideration of allowing POs with known risk for pts comfort and quality of life. Pt asking for POs and "some good chocolate."     HPI HPI: 80 year old with past medical history of dementia and atrial fibrillation he lives with his son and most of the history was obtained by him as the patient was confused. He started vomiting on the day prior to admission before he went to bed. The morning of his admit when his son got up to the bathroom he found him on the ground unable to answer questions. His son related he usually uses a walker but he couldn't walk. He remained confused most of the day but had no fever chills or cough. In the ED: After normal saline he became more responsive but was not making any sense. Chest x-ray showed L lower lobe infiltrate.   Pertinent Vitals NA  SLP Plan  Continue with current plan of care    Recommendations Diet recommendations: NPO Medication Administration: Via alternative means              Oral Care Recommendations: Oral care Q4 per protocol Follow up Recommendations: Skilled Nursing facility Plan: Continue with current plan of care    GO    HiLLCrest HospitalBonnie Morissa Obeirne, MA CCC-SLP  [phone]  Ronald Prince 05/07/2013, 4:05 PM

## 2013-05-07 NOTE — Progress Notes (Signed)
NUTRITION FOLLOW UP/ Consult for TF Management  Intervention:   Initiate Jevity 1.2 @ 20 ml/hr via NGT and increase by 10 ml every 4 hours to goal rate of 60 ml/hr. At goal rate, tube feeding regimen will provide 1728 kcal, 80 grams of protein, and 1162 ml of H2O.  Closely monitor residuals and bowel function for TF tolerance (? Ileus)  Recommend monitor magnesium, potassium, and phosphorus daily for at least 3 days, MD to replete as needed, as pt is at risk for refeeding syndrome given NPO status for past 7 days.  Once continuous infusions are discontinued, add 130 ml free water flushes QID to provide an additional 520 ml of water (total of 1682 ml free water daily).  RD to continue to monitor  Nutrition Dx:   Inadequate oral intake related to inability to eat as evidenced by NPO status and NGT tube placement; ongoing  Goal:   Pt to meet >/= 90% of their estimated nutrition needs; currently unmet  Monitor:   TF initiation/rate and tolerance, weight trend, labs  Assessment:   80 year old with past medical history of dementia and atrial fibrillation he lives with his son and most of the history was obtained by him as the patient is confused. He started on the day prior to admission before he went to bed that he started vomiting. His morning when his son got up to the bathroom and found him on the ground unable to answer questions. His son relates is usually uses a walker but he couldn't walk.  LOS 7 Pt with possible early ileus per chart.  Pt assessed by RD 4/13 4/14: RD consulted for enteral/tube feeding initiation and management. TF to be started at noon today. Pt may be at risk for refeeding syndrome given NPO status since admission.   Height: Ht Readings from Last 1 Encounters:  05/04/13 5\' 8"  (1.727 m)    Weight Status:   Wt Readings from Last 1 Encounters:  05/04/13 145 lb 11.6 oz (66.1 kg)  admission weight 147 lb (04/30/13)  Re-estimated needs:  Kcal: 1500-1700  Protein:  75-90 grams  Fluid: 1.5-1.7 L/day  Skin: intact  Diet Order:  NPO   Intake/Output Summary (Last 24 hours) at 05/07/13 1214 Last data filed at 05/07/13 0951  Gross per 24 hour  Intake 2822.92 ml  Output   2600 ml  Net 222.92 ml    Last BM: 4/13   Labs:   Recent Labs Lab 05/03/13 0402 05/04/13 1257 05/07/13 0332  NA 142 136* 138  K 3.4* 3.5* 3.9  CL 106 100 103  CO2 22 24 21   BUN 19 12 8   CREATININE 0.82 0.77 0.72  CALCIUM 8.2* 8.3* 8.3*  MG  --  1.8  1.8  --   GLUCOSE 84 125* 89    CBG (last 3)  No results found for this basename: GLUCAP,  in the last 72 hours  Scheduled Meds: . ferrous sulfate  300 mg Per Tube BID WC  . heparin  5,000 Units Subcutaneous 3 times per day  . metoprolol  5 mg Intravenous 4 times per day  . polyethylene glycol  17 g Oral Daily  . sodium chloride  3 mL Intravenous Q12H    Continuous Infusions: . 0.9 % NaCl with KCl 20 mEq / L 50 mL/hr at 05/06/13 2313  . feeding supplement (JEVITY 1.2 CAL)      Ian Malkineanne Barnett RD, LDN Inpatient Clinical Dietitian Pager: [phone] After Hours Pager: [phone]

## 2013-05-07 NOTE — Progress Notes (Signed)
Progress Note  Stephenie AcresCharles Carriger ZOX:[id number] DOB: 05-21-1917 DOA: 04/30/2013 PCP: Elvina SidleLAUENSTEIN,KURT, MD    Admit HPI / Brief Narrative: 80 year old with past medical history of dementia and atrial fibrillation he lives with his son. He started vomiting on the day prior to admission before he went to bed. The morning of his admit when his son got up he found him on the floor unable to answer questions. His son related he usually uses a walker but he couldn't walk. He remained confused most of the day but had no fever chills or cough.   In the ED:  After normal saline he became more responsive but was not making any sense. An ABG was done that showed a pH of 7.28 PCO2 of 34 PO2 of 156 on BiPAP. CBC showed a white count of 13 with a left shift - chest x-ray showed L lower lobe infiltrate.  HPI/Subjective: Opens eyes Unable to understand what he is saying   Assessment/Plan:  Hypoxic Respiratory failure, acute / LLL CAP  -Unclear if this represents community acquired pneumonia or possibly an aspiration event -empiric antibiotics and follow for clinical improvement - stop date for anbx's 4/14  Sepsis (white blood cell 13.2, HR 96, CAP) Sepsis physiology resolved - continue supportive care  AKI  -Likely simple prerenal azotemia - Scr stable and BUN trend down - IVFs as above - labs in am  Hypokalemia -prn KCL per tube  Toxic metabolic encephalopathy in setting of chronic Dementia  - MRI 4/12 negative for cause of persistent AMS - Dr. Joseph ArtWoods d/w son Cyndia Diver(Tim Libbey) and baseline MS pre admit was ability to communicate with family, and perform his daily ADLs  -failed SLP eval- await re-eval -NG tube in place- start tube feeds 4/14  HTN -cont IV Lopressor scheduled since NPO (was on Norvasc pre admit)  Iron def anemia -replete iron per tube  Possible UTI -UA not normal - could be related to DH/CAP - urine cx 4/10 no growth  Possible early ileus/?? pneumoperitoneum -? Of free air on initial  CXR but CT done in ED confirmed NO FREE AIR-  -watch for recurrence of ileus sx's  Nodules in B kidneys -indeterminate via CT scan - given advanced age would NOT pursue further work up  Code Status: DO NOT RESUSCITATE Family Communication:  Spoke with son Disposition Plan: ? goals  Consultants: None  Procedures: None  Antibiotics: Azithromycin 4/7>> Cefepime 4/7 Ceftriaxone 4/7>> Flagyl 4/7 Vancomycin 4/7  DVT prophylaxis: SQ heparin  Objective: Blood pressure 170/90, pulse 74, temperature 97.8 F (36.6 C), temperature source Oral, resp. rate 20, height 5\' 8"  (1.727 m), weight 66.1 kg (145 lb 11.6 oz), SpO2 100.00%.  Intake/Output Summary (Last 24 hours) at 05/07/13 1149 Last data filed at 05/07/13 0951  Gross per 24 hour  Intake 2822.92 ml  Output   2600 ml  Net 222.92 ml   Exam: General: No acute respiratory distress Lungs: CTA -  oxygen at 2L Cardiovascular: Regular rate and rhythm without murmur gallop or rub Abdomen: Nontender, nondistended, soft, bowel sounds positive, no rebound, no ascites, no appreciable mass Extremities: No significant cyanosis, clubbing, or edema bilateral lower extremities  Data Reviewed: Basic Metabolic Panel:  Recent Labs Lab 05/01/13 0355 05/02/13 0330 05/03/13 0402 05/04/13 1257 05/07/13 0332  NA 143 144 142 136* 138  K 3.8 3.6* 3.4* 3.5* 3.9  CL 110 111 106 100 103  CO2 22 21 22 24 21   GLUCOSE 97 83 84 125* 89  BUN 38*  28* 19 12 8   CREATININE 1.18 0.96 0.82 0.77 0.72  CALCIUM 8.4 8.3* 8.2* 8.3* 8.3*  MG  --   --   --  1.8  1.8  --    Liver Function Tests:  Recent Labs Lab 05/01/13 0355 05/04/13 1257  AST 25 21  ALT 16 14  ALKPHOS 51 57  BILITOT 0.4 0.3  PROT 5.4* 5.5*  ALBUMIN 2.6* 2.6*   CBC:  Recent Labs Lab 05/01/13 0355 05/02/13 0330 05/03/13 0402 05/04/13 1257 05/07/13 0332  WBC 11.8* 8.4 7.0 4.6 6.9  NEUTROABS  --   --   --  3.1  --   HGB 9.7* 8.9* 8.9* 9.6* 10.0*  HCT 28.4* 26.3* 25.8*  27.3* 28.4*  MCV 98.3 98.9 96.6 94.8 94.4  PLT 192 173 184 201 245   Cardiac Enzymes: No results found for this basename: CKTOTAL, CKMB, CKMBINDEX, TROPONINI,  in the last 168 hours BNP (last 3 results)  Recent Labs  04/30/13 0800  PROBNP 523.6*    Recent Results (from the past 240 hour(s))  CULTURE, BLOOD (ROUTINE X 2)     Status: None   Collection Time    04/30/13  8:00 AM      Result Value Ref Range Status   Specimen Description BLOOD RIGHT ANTECUBITAL   Final   Special Requests BOTTLES DRAWN AEROBIC AND ANAEROBIC   Final   Culture  Setup Time     Final   Value: 04/30/2013 13:31     Performed at [facility]   Culture     Final   Value: NO GROWTH 5 DAYS     Performed at [facility]   Report Status 05/06/2013 FINAL   Final  MRSA PCR SCREENING     Status: None   Collection Time    04/30/13  4:05 PM      Result Value Ref Range Status   MRSA by PCR NEGATIVE  NEGATIVE Final   Comment:            The GeneXpert MRSA Assay (FDA     approved for NASAL specimens     only), is one component of a     comprehensive MRSA colonization     surveillance program. It is not     intended to diagnose MRSA     infection nor to guide or     monitor treatment for     MRSA infections.  CULTURE, BLOOD (ROUTINE X 2)     Status: None   Collection Time    04/30/13  5:15 PM      Result Value Ref Range Status   Specimen Description BLOOD RIGHT HAND   Final   Special Requests BOTTLES DRAWN AEROBIC ONLY 5CC   Final   Culture  Setup Time     Final   Value: 04/30/2013 21:17     Performed at [facility]   Culture     Final   Value: NO GROWTH 5 DAYS     Performed at [facility]   Report Status 05/06/2013 FINAL   Final  URINE CULTURE     Status: None   Collection Time    05/03/13  8:34 AM      Result Value Ref Range Status   Specimen Description URINE, RANDOM   Final   Special Requests NONE   Final   Culture  Setup Time     Final   Value: 05/03/2013  09:18  Performed at [facility]   Colony Count     Final   Value: NO GROWTH     Performed at [facility]   Culture     Final   Value: NO GROWTH     Performed at [facility]   Report Status 05/04/2013 FINAL   Final     Studies:  Recent x-ray studies have been reviewed in detail by the Attending Physician  Scheduled Meds:  Scheduled Meds: . ferrous sulfate  300 mg Per Tube BID WC  . heparin  5,000 Units Subcutaneous 3 times per day  . metoprolol  5 mg Intravenous 4 times per day  . polyethylene glycol  17 g Oral Daily  . sodium chloride  3 mL Intravenous Q12H    Time spent on care of this patient: 35 mins  Joseph ArtJessica U Jalana Moore DO [phone] [facility]  If 7PM-7AM, please contact night-coverage [email or web address] Password [email or web address] 05/07/2013, 11:49 AM   LOS: 7 days

## 2013-05-07 NOTE — Progress Notes (Signed)
Physical Therapy Treatment Patient Details Name: Ronald Prince MRN: [id number] DOB: 10/08/1917 Today's Date: 05/07/2013    History of Present Illness 80 year old with past medical history of dementia and atrial fibrillation he lives with his son. The morning of his admit his son found him on the ground unable to answer questions. His son related he usually uses a walker but he couldn't walk.    PT Comments    Pt too restless and agitated to participate very well, but was encouraged to do some warm up exercise, do sitting balance EOB and complete sit to stand x2 at EOB  Follow Up Recommendations  SNF;Supervision/Assistance - 24 hour     Equipment Recommendations  Other (comment) ((son wants bed rail to affix to pt's bed))    Recommendations for Other Services       Precautions / Restrictions Precautions Precautions: Fall    Mobility  Bed Mobility Overal bed mobility: +2 for physical assistance Bed Mobility: Sidelying to Sit;Sit to Supine   Sidelying to sit: Max assist;+2 for physical assistance   Sit to supine: Max assist;+2 for physical assistance   General bed mobility comments: pt still not following commands or assisting purposefull.  Needed significant truncal assist  Transfers Overall transfer level: Needs assistance   Transfers: Sit to/from Stand Sit to Stand: Total assist;+2 physical assistance         General transfer comment: stood x 2;  1st trial with 2 person assist on either side, second trial pt assisted face to face with +2 assist on the side.  Pt stood more upright and for a longer period of time ~20 secs 2nd trial, but in each trial unable to get pt's knees fully extended with R almost showing tendency for flexor withdrawal.  Ambulation/Gait                 Stairs            Wheelchair Mobility    Modified Rankin (Stroke Patients Only)       Balance Overall balance assessment: Needs assistance Sitting-balance support: Bilateral  upper extremity supported;Feet supported Sitting balance-Leahy Scale: Poor     Standing balance support: Bilateral upper extremity supported;During functional activity Standing balance-Leahy Scale: Zero                      Cognition Arousal/Alertness: Awake/alert Behavior During Therapy: Flat affect;Agitated Overall Cognitive Status: Difficult to assess                      Exercises      General Comments        Pertinent Vitals/Pain     Home Living                      Prior Function            PT Goals (current goals can now be found in the care plan section) Acute Rehab PT Goals PT Goal Formulation: With family Time For Goal Achievement: 05/09/13 Potential to Achieve Goals: Fair Progress towards PT goals: Not progressing toward goals - comment (too agitated to participate well)    Frequency  Min 2X/week    PT Plan Current plan remains appropriate    Co-evaluation             End of Session   Activity Tolerance: Patient tolerated treatment well Patient left: in bed;with call bell/phone within reach;with bed alarm set  Time: [id number] PT Time Calculation (min): 20 min  Charges:  $Therapeutic Activity: 8-22 mins                    G Codes:      Ronald Prince 05/07/2013, 5:21 PM 05/07/2013  [place] BingKen Shaye Elling, PT [phone]  (pager)

## 2013-05-08 LAB — CBC
HCT: 28.1 % — ABNORMAL LOW (ref 39.0–52.0)
Hemoglobin: 9.8 g/dL — ABNORMAL LOW (ref 13.0–17.0)
MCH: 33.4 pg (ref 26.0–34.0)
MCHC: 34.9 g/dL (ref 30.0–36.0)
MCV: 95.9 fL (ref 78.0–100.0)
PLATELETS: 288 10*3/uL (ref 150–400)
RBC: 2.93 MIL/uL — ABNORMAL LOW (ref 4.22–5.81)
RDW: 13 % (ref 11.5–15.5)
WBC: 8.4 10*3/uL (ref 4.0–10.5)

## 2013-05-08 LAB — BASIC METABOLIC PANEL
BUN: 12 mg/dL (ref 6–23)
CALCIUM: 8.4 mg/dL (ref 8.4–10.5)
CO2: 24 mEq/L (ref 19–32)
CREATININE: 0.76 mg/dL (ref 0.50–1.35)
Chloride: 104 mEq/L (ref 96–112)
GFR calc Af Amer: 88 mL/min — ABNORMAL LOW (ref >90)
GFR calc non Af Amer: 76 mL/min — ABNORMAL LOW (ref >90)
Glucose, Bld: 101 mg/dL — ABNORMAL HIGH (ref 70–99)
Potassium: 4.1 mEq/L (ref 3.7–5.3)
SODIUM: 138 meq/L (ref 137–147)

## 2013-05-08 LAB — GLUCOSE, CAPILLARY
GLUCOSE-CAPILLARY: 128 mg/dL — AB (ref 70–99)
GLUCOSE-CAPILLARY: 114 mg/dL — AB (ref 70–99)
GLUCOSE-CAPILLARY: 113 mg/dL — AB (ref 70–99)
GLUCOSE-CAPILLARY: 107 mg/dL — AB (ref 70–99)
Glucose-Capillary: 119 mg/dL — ABNORMAL HIGH (ref 70–99)
Glucose-Capillary: 129 mg/dL — ABNORMAL HIGH (ref 70–99)

## 2013-05-08 LAB — MAGNESIUM: MAGNESIUM: 1.9 mg/dL (ref 1.5–2.5)

## 2013-05-08 MED ORDER — BISACODYL 10 MG RE SUPP
10.0000 mg | Freq: Once | RECTAL | Status: AC
  Administered 2013-05-08: 10 mg via RECTAL
  Filled 2013-05-08: qty 1

## 2013-05-08 NOTE — Progress Notes (Signed)
NUTRITION FOLLOW UP  Intervention:   Continue Jevity 1.2 @ 60 ml/hr via NGT. At goal rate, tube feeding regimen provides 1728 kcal, 80 grams of protein, and 1162 ml of H2O.  Monitoring magnesium, potassium, and phosphorus daily for at least 3 days, MD to replete as needed, as pt is at risk for refeeding syndrome given NPO status for past 7 days.  Once continuous infusions are discontinued, add 130 ml free water flushes QID to provide an additional 520 ml of water (total of 1682 ml free water daily).  RD to continue to monitor  Nutrition Dx:   Inadequate oral intake related to inability to eat as evidenced by NPO status and NGT tube placement; ongoing  Goal:   Pt to meet >/= 90% of their estimated nutrition needs; being met  Monitor:   TF initiation/rate and tolerance, weight trend, labs  Assessment:   79 year old with past medical history of dementia and atrial fibrillation he lives with his son and most of the history was obtained by him as the patient is confused. He started on the day prior to admission before he went to bed that he started vomiting. His morning when his son got up to the bathroom and found him on the ground unable to answer questions. His son relates is usually uses a walker but he couldn't walk.  LOS 7 Pt with possible early ileus per chart.  Pt assessed by RD 4/13 4/14: RD consulted for enteral/tube feeding initiation and management. TF to be started at noon today. Pt may be at risk for refeeding syndrome given NPO status since admission.  4/15: Pt tolerating TF at goal rate of 60 ml/hr with 0 ml residuals. Last BM 4/13. Weight has decreased 4 lbs since admission- will continue to monitor.  Labs: Potassium, Magnesium and Phosphorus are WNL, low calcium, low hemoglobin  Height: Ht Readings from Last 1 Encounters:  05/04/13 '5\' 8"'  (1.727 m)    Weight Status:   Wt Readings from Last 1 Encounters:  05/08/13 143 lb 11.8 oz (65.2 kg)  admission weight 147 lb  (04/30/13)  Re-estimated needs:  Kcal: 1500-1700  Protein: 75-90 grams  Fluid: 1.5-1.7 L/day  Skin: intact  Diet Order:  NPO   Intake/Output Summary (Last 24 hours) at 05/08/13 1544 Last data filed at 05/08/13 0503  Gross per 24 hour  Intake 2242.5 ml  Output    800 ml  Net 1442.5 ml    Last BM: 4/13   Labs:   Recent Labs Lab 05/03/13 0402 05/04/13 1257 05/07/13 0332 05/08/13 0502  NA 142 136* 138 138  K 3.4* 3.5* 3.9 4.1  CL 106 100 103 104  CO2 '22 24 21 24  ' BUN '19 12 8 12  ' CREATININE 0.82 0.77 0.72 0.76  CALCIUM 8.2* 8.3* 8.3* 8.4  MG  --  1.8  1.8  --  1.9  PHOS  --   --  2.7  --   GLUCOSE 84 125* 89 101*    CBG (last 3)   Recent Labs  05/08/13 0439 05/08/13 0806 05/08/13 1223  GLUCAP 119* 128* 114*    Scheduled Meds: . ferrous sulfate  300 mg Per Tube BID WC  . heparin  5,000 Units Subcutaneous 3 times per day  . metoprolol  5 mg Intravenous 4 times per day  . polyethylene glycol  17 g Oral Daily  . sodium chloride  3 mL Intravenous Q12H    Continuous Infusions: . 0.9 % NaCl with KCl 20  mEq / L 50 mL/hr at 05/06/13 2313  . feeding supplement (JEVITY 1.2 CAL) 60 mL/hr (05/08/13 0451)    Pryor Ochoa RD, LDN Inpatient Clinical Dietitian Pager: [phone] After Hours Pager: [phone]

## 2013-05-08 NOTE — Progress Notes (Signed)
Progress Note  Ronald AcresCharles Prince WGN:[id number] DOB: 05/03/1917 DOA: 04/30/2013 PCP: Elvina SidleLAUENSTEIN,KURT, MD    Admit HPI / Brief Narrative: 80 year old with past medical history of dementia and atrial fibrillation he lives with his son. He started vomiting on the day prior to admission before he went to bed. The morning of his admit when his son got up he found him on the floor unable to answer questions. His son related he usually uses a walker but he couldn't walk. He remained confused most of the day but had no fever chills or cough.   In the ED:  After normal saline he became more responsive but was not making any sense. An ABG was done that showed a pH of 7.28 PCO2 of 34 PO2 of 156 on BiPAP. CBC showed a white count of 13 with a left shift - chest x-ray showed L lower lobe infiltrate.  HPI/Subjective: Opens eyes Unable to understand what he is saying   Assessment/Plan:  Hypoxic Respiratory failure, acute / LLL CAP  -Unclear if this represents community acquired pneumonia or possibly an aspiration event -empiric antibiotics and follow for clinical improvement - stop date for anbx's 4/14 -MRI negative for CVA  Sepsis (white blood cell 13.2, HR 96, CAP) Sepsis physiology resolved - continue supportive care  AKI  -Likely simple prerenal azotemia - Scr stable and BUN trend down  Hypokalemia -prn KCL per tube  Toxic metabolic encephalopathy in setting of chronic Dementia  - MRI 4/12 negative for cause of persistent AMS - Dr. Joseph ArtWoods d/w son Ronald Prince(Ronald Prince) and baseline MS pre admit was ability to communicate with family, and perform his daily ADLs  -failed SLP eval- palliative care consult -NG tube in place- start tube feeds 4/14 MRI negative  HTN  Iron def anemia -replete iron per tube  Possible UTI -UA not normal - could be related to DH/CAP - urine cx 4/10 no growth  Possible early ileus/?? pneumoperitoneum -? Of free air on initial CXR but CT done in ED confirmed NO FREE AIR-    -watch for recurrence of ileus sx's -add suppository  Nodules in B kidneys -indeterminate via CT scan - given advanced age would NOT pursue further work up  Code Status: DO NOT RESUSCITATE Family Communication:  Spoke with son Disposition Plan: ? Goals of care with palliative care  Consultants: None  Procedures: None  Antibiotics: Azithromycin 4/7>> Cefepime 4/7 Ceftriaxone 4/7>> Flagyl 4/7 Vancomycin 4/7  DVT prophylaxis: SQ heparin  Objective: Blood pressure 146/77, pulse 70, temperature 98.4 F (36.9 C), temperature source Oral, resp. rate 16, height 5\' 8"  (1.727 m), weight 65.2 kg (143 lb 11.8 oz), SpO2 100.00%.  Intake/Output Summary (Last 24 hours) at 05/08/13 0901 Last data filed at 05/08/13 0503  Gross per 24 hour  Intake 2242.5 ml  Output   1825 ml  Net  417.5 ml   Exam: General: No acute respiratory distress Lungs: CTA -  oxygen at 2L Cardiovascular: Regular rate and rhythm without murmur gallop or rub Abdomen: Nontender, nondistended, soft, bowel sounds positive, no rebound, no ascites, no appreciable mass Extremities: No significant cyanosis, clubbing, or edema bilateral lower extremities  Data Reviewed: Basic Metabolic Panel:  Recent Labs Lab 05/02/13 0330 05/03/13 0402 05/04/13 1257 05/07/13 0332 05/08/13 0502  NA 144 142 136* 138 138  K 3.6* 3.4* 3.5* 3.9 4.1  CL 111 106 100 103 104  CO2 21 22 24 21 24   GLUCOSE 83 84 125* 89 101*  BUN 28* 19 12 8  12  CREATININE 0.96 0.82 0.77 0.72 0.76  CALCIUM 8.3* 8.2* 8.3* 8.3* 8.4  MG  --   --  1.8  1.8  --  1.9  PHOS  --   --   --  2.7  --    Liver Function Tests:  Recent Labs Lab 05/04/13 1257  AST 21  ALT 14  ALKPHOS 57  BILITOT 0.3  PROT 5.5*  ALBUMIN 2.6*   CBC:  Recent Labs Lab 05/02/13 0330 05/03/13 0402 05/04/13 1257 05/07/13 0332 05/08/13 0502  WBC 8.4 7.0 4.6 6.9 8.4  NEUTROABS  --   --  3.1  --   --   HGB 8.9* 8.9* 9.6* 10.0* 9.8*  HCT 26.3* 25.8* 27.3* 28.4*  28.1*  MCV 98.9 96.6 94.8 94.4 95.9  PLT 173 184 201 245 288   Cardiac Enzymes: No results found for this basename: CKTOTAL, CKMB, CKMBINDEX, TROPONINI,  in the last 168 hours BNP (last 3 results)  Recent Labs  04/30/13 0800  PROBNP 523.6*    Recent Results (from the past 240 hour(s))  CULTURE, BLOOD (ROUTINE X 2)     Status: None   Collection Time    04/30/13  8:00 AM      Result Value Ref Range Status   Specimen Description BLOOD RIGHT ANTECUBITAL   Final   Special Requests BOTTLES DRAWN AEROBIC AND ANAEROBIC   Final   Culture  Setup Time     Final   Value: 04/30/2013 13:31     Performed at [facility]   Culture     Final   Value: NO GROWTH 5 DAYS     Performed at [facility]   Report Status 05/06/2013 FINAL   Final  MRSA PCR SCREENING     Status: None   Collection Time    04/30/13  4:05 PM      Result Value Ref Range Status   MRSA by PCR NEGATIVE  NEGATIVE Final   Comment:            The GeneXpert MRSA Assay (FDA     approved for NASAL specimens     only), is one component of a     comprehensive MRSA colonization     surveillance program. It is not     intended to diagnose MRSA     infection nor to guide or     monitor treatment for     MRSA infections.  CULTURE, BLOOD (ROUTINE X 2)     Status: None   Collection Time    04/30/13  5:15 PM      Result Value Ref Range Status   Specimen Description BLOOD RIGHT HAND   Final   Special Requests BOTTLES DRAWN AEROBIC ONLY 5CC   Final   Culture  Setup Time     Final   Value: 04/30/2013 21:17     Performed at [facility]   Culture     Final   Value: NO GROWTH 5 DAYS     Performed at [facility]   Report Status 05/06/2013 FINAL   Final  URINE CULTURE     Status: None   Collection Time    05/03/13  8:34 AM      Result Value Ref Range Status   Specimen Description URINE, RANDOM   Final   Special Requests NONE   Final   Culture  Setup Time     Final   Value: 05/03/2013 09:18  Performed at [facility]   Colony Count     Final   Value: NO GROWTH     Performed at [facility]   Culture     Final   Value: NO GROWTH     Performed at [facility]   Report Status 05/04/2013 FINAL   Final       Scheduled Meds:  Scheduled Meds: . ferrous sulfate  300 mg Per Tube BID WC  . heparin  5,000 Units Subcutaneous 3 times per day  . metoprolol  5 mg Intravenous 4 times per day  . polyethylene glycol  17 g Oral Daily  . sodium chloride  3 mL Intravenous Q12H    Time spent on care of this patient: 35 mins  Joseph ArtJessica U Dejaun Vidrio DO [phone] [facility]  If 7PM-7AM, please contact night-coverage [email or web address] Password [email or web address] 05/08/2013, 9:01 AM   LOS: 8 days

## 2013-05-08 NOTE — Progress Notes (Signed)
Patient ZO:[id number]:Ronald Prince      DOB: Apr 19, 1917      WUJ:[id number]  Patient is comfortable.  No family at the bedside.  Will call to schedule time with family in the am ASAP.   Gabi Mcfate L. Ladona Ridgelaylor, MD MBA The Palliative Medicine Team at [facility] Team Phone: [phone] Pager: [phone]

## 2013-05-09 DIAGNOSIS — Z515 Encounter for palliative care: Secondary | ICD-10-CM

## 2013-05-09 DIAGNOSIS — R1319 Other dysphagia: Secondary | ICD-10-CM

## 2013-05-09 LAB — GLUCOSE, CAPILLARY
GLUCOSE-CAPILLARY: 122 mg/dL — AB (ref 70–99)
Glucose-Capillary: 108 mg/dL — ABNORMAL HIGH (ref 70–99)
Glucose-Capillary: 111 mg/dL — ABNORMAL HIGH (ref 70–99)
Glucose-Capillary: 115 mg/dL — ABNORMAL HIGH (ref 70–99)
Glucose-Capillary: 119 mg/dL — ABNORMAL HIGH (ref 70–99)
Glucose-Capillary: 130 mg/dL — ABNORMAL HIGH (ref 70–99)
Glucose-Capillary: 99 mg/dL (ref 70–99)

## 2013-05-09 MED ORDER — BISACODYL 10 MG RE SUPP: 10.0000 mg | Freq: Every day | RECTAL | Status: DC | PRN

## 2013-05-09 MED ORDER — LACTULOSE 10 GM/15ML PO SOLN
20.0000 g | Freq: Two times a day (BID) | ORAL | Status: DC
  Administered 2013-05-09: 20 g via ORAL
  Filled 2013-05-09 (×5): qty 30

## 2013-05-09 MED ORDER — STARCH (THICKENING) PO POWD
ORAL | Status: DC | PRN
  Filled 2013-05-09: qty 227

## 2013-05-09 NOTE — Progress Notes (Signed)
Pt has not had a sitter at all today, did not have one last night either.  Pt's mitten's were removed today and is doing well without them. [facility]

## 2013-05-09 NOTE — Progress Notes (Signed)
Palliative to meet with Mr. Tami RibasDoss' son, Jorja Loaim [phone]), 4/16 at 1300. Thank you for this consult.   Yong ChannelAlicia Sincere Liuzzi, NP Palliative Medicine Team Pager # [phone] (M-F 8a-5p) Team Phone # [phone] (Nights/Weekends)

## 2013-05-09 NOTE — Clinical Social Work Note (Signed)
[facility] with sitter- cannot accept him until he is out of the mittens and sitter/restraints.   Reece LevyJanet Shahrukh Pasch, MSW, Theresia MajorsLCSWA [phone]

## 2013-05-09 NOTE — Consult Note (Signed)
Patient FG:HWEXHBZ Bressman      DOB: October 18, 1917      JIR:[id number]     Consult Note from the Palliative Medicine Team at [facility] Requested by: Dr. Eliseo Squires     PCP: Robyn Haber, MD Reason for Consultation: [place] and options.    Phone Number:[phone]  Assessment of patients Current state: Mr. Moone is a 80 yo male with from home (lives with son). Admitted with vomiting, found on ground - unable to walk, and unable to answer questions. Being treated for hypoxic respiratory failure with LLL community acquired pneumonia. PMH of dementia, atrial fibrillation, HTN. Now unable to swallow safely with temporary feeding tube.  I met today with Mr. Robison' son Octavia Bruckner [phone], [phone]), daughter in law Lorna Few [phone], [phone]), and granddaughter Larene Beach [phone]). The family understands Mr. Hlavac' poor prognosis and we discussed the natural progression of his dementia. They tell me that they have spoken with a Education officer, museum and he should go to [facility] at discharge. Tim tells me that it has been increasingly more difficult to care for him at home. He says that Mr. Mallek used a walker but needed more assistance getting around, helped get to bathroom but he was able to wash himself (with shower chair), and appetite was beginning to decrease at times. Lorna Few says that he likes television, to watch the birds, and that he was sleeping a lot at home. They confirm DNR. We complete MOST form: DNR, comfort measures (no hospital readmission), determine use or limitation of antibiotics, no IV fluids, no feeding tube. They wish to have palliative care services follow at SNF to help them connect with hospice and achieve goals as instructed on MOST form.    Goals of Care: 1.  Code Status: DNR   2. Scope of Treatment: 1. Nutritional Support/Tube Feeds: continue temporarily until discharge per family request 2. Minimize medications for comfort 3. Comfort feeds desired   4. Disposition: SNF rehab  with palliative to follow   3. Symptom Management: 1. Pain: Morphine prn.  2. Bowel Regimen: Dulcolax supp prn. 3. Fever: Acetaminophen prn.  4. Nausea/Vomiting: Ondansetron prn.  5. Weakness: PT following.  6. Dysphagia: SLP following and to provide recommendations for comfort feeds.   4. Psychosocial: Emotional support provided to patient and family.   Patient Documents Completed or Given: Document Given Completed  Advanced Directives Pkt    MOST  yes  DNR  In chart already  Gone from [facility]    Hard Choices      Brief HPI: 80 yo male with from home (lives with son). Admitted with vomiting, found on ground - unable to walk, and unable to answer questions. Being treated for hypoxic respiratory failure with LLL community acquired pneumonia. PMH of dementia, atrial fibrillation, HTN. Now unable to swallow safely with temporary feeding tube.    ROS: Unable to elicit - mumbled/garbled speech    PMH:  Past Medical History  Diagnosis Date  . Atrial fibrillation   . Dementia   . Hypertension   . Shortness of breath   . Pneumonia 04/2013  . GERD (gastroesophageal reflux disease)      PSH: Past Surgical History  Procedure Laterality Date  . Cataract extraction     I have reviewed the [place] and SH and  If appropriate update it with new information. Allergies  Allergen Reactions  . Penicillins    Scheduled Meds: . ferrous sulfate  300 mg Per Tube BID WC  . heparin  5,000  Units Subcutaneous 3 times per day  . lactulose  20 g Oral BID  . metoprolol  5 mg Intravenous 4 times per day  . polyethylene glycol  17 g Oral Daily  . sodium chloride  3 mL Intravenous Q12H   Continuous Infusions: . 0.9 % NaCl with KCl 20 mEq / L 50 mL/hr at 05/08/13 1802  . feeding supplement (JEVITY 1.2 CAL) 1,000 mL (05/09/13 0050)   PRN Meds:.acetaminophen, acetaminophen, morphine injection, ondansetron (ZOFRAN) IV, ondansetron, sodium phosphate    BP 140/65  Pulse 71  Temp(Src) 97.6 F  (36.4 C) (Axillary)  Resp 18  Ht _0  (1.727 m)  Wt 65.545 kg (144 lb 8 oz)  BMI 21.98 kg/m2  SpO2 99%   PPS: 30% at best   Intake/Output Summary (Last 24 hours) at 05/09/13 1248 Last data filed at 05/09/13 0130  Gross per 24 hour  Intake 1022.5 ml  Output    800 ml  Net  222.5 ml   LBM: 05/08/13                         Physical Exam:  General: NAD, alert, awake HEENT: [place]/AT, no JVD, moist mucous membranes Chest: CTA throughout, 2L [place], no labored breathing CVS: RRR, S1 S2 Abdomen: Soft, NT, ND, +BS Ext: MAE, no edema, warm to touch Neuro: Alert, oriented to person  Labs: CBC    Component Value Date/Time   WBC 8.4 05/08/2013 0502   RBC 2.93* 05/08/2013 0502   RBC 2.80* 05/05/2013 2023   HGB 9.8* 05/08/2013 0502   HCT 28.1* 05/08/2013 0502   PLT 288 05/08/2013 0502   MCV 95.9 05/08/2013 0502   MCH 33.4 05/08/2013 0502   MCHC 34.9 05/08/2013 0502   RDW 13.0 05/08/2013 0502   LYMPHSABS 1.1 05/04/2013 1257   MONOABS 0.4 05/04/2013 1257   EOSABS 0.1 05/04/2013 1257   BASOSABS 0.0 05/04/2013 1257    BMET    Component Value Date/Time   NA 138 05/08/2013 0502   K 4.1 05/08/2013 0502   CL 104 05/08/2013 0502   CO2 24 05/08/2013 0502   GLUCOSE 101* 05/08/2013 0502   BUN 12 05/08/2013 0502   CREATININE 0.76 05/08/2013 0502   CREATININE 1.09 06/13/2011 1428   CALCIUM 8.4 05/08/2013 0502   GFRNONAA 76* 05/08/2013 0502   GFRAA 88* 05/08/2013 0502    CMP     Component Value Date/Time   NA 138 05/08/2013 0502   K 4.1 05/08/2013 0502   CL 104 05/08/2013 0502   CO2 24 05/08/2013 0502   GLUCOSE 101* 05/08/2013 0502   BUN 12 05/08/2013 0502   CREATININE 0.76 05/08/2013 0502   CREATININE 1.09 06/13/2011 1428   CALCIUM 8.4 05/08/2013 0502   PROT 5.5* 05/04/2013 1257   ALBUMIN 2.6* 05/04/2013 1257   AST 21 05/04/2013 1257   ALT 14 05/04/2013 1257   ALKPHOS 57 05/04/2013 1257   BILITOT 0.3 05/04/2013 1257   GFRNONAA 76* 05/08/2013 0502   GFRAA 88* 05/08/2013 0502    Time In Time Out Total Time  Spent with Patient Total Overall Time  1300 1420 6mn 842m    Greater than 50%  of this time was spent counseling and coordinating care related to the above assessment and plan.  AlVinie SillNP Palliative Medicine Team Pager # [phone]-F 8a-5p) Team Phone # [phone]/Weekends)

## 2013-05-09 NOTE — Progress Notes (Signed)
Speech Language Pathology Treatment: Dysphagia  Patient Details Name: Ronald Prince MRN: [id number] DOB: 05/18/1917 Today's Date: 05/09/2013 Time: 3149-7026 SLP Time Calculation (min): 30 min  Assessment / Plan / Recommendation Clinical Impression  Goal is comfort feeds following Palliative Care meeting.  SLP provided skilled therapy and educated family regarding Dys 1 texture and nectar liquid recommendation, safest positioning, swallow strategies during meals, remain upright 45 min after meals, thickener.  Observed with nectar thick chocolate milk and demonstrated how to cue pt. For swallow strategies.  Family verbalized understanding.  No follow needed at this time.   HPI HPI: 80 year old with past medical history of dementia and atrial fibrillation he lives with his son and most of the history was obtained by him as the patient was confused. He started vomiting on the day prior to admission before he went to bed. The morning of his admit when his son got up to the bathroom he found him on the ground unable to answer questions. His son related he usually uses a walker but he couldn't walk. He remained confused most of the day but had no fever chills or cough. In the ED: After normal saline he became more responsive but was not making any sense. Chest x-ray showed L lower lobe infiltrate.  Palliative care meeting 4/16 with decision for comfort feeds with known risks.   Pertinent Vitals WDL  SLP Plan  All goals met    Recommendations Diet recommendations: Dysphagia 2 (fine chop);Nectar-thick liquid              Plan: All goals met    GO     Orbie Pyo [facility] 05/09/2013, 3:49 PM

## 2013-05-09 NOTE — Progress Notes (Signed)
Physical Therapy Treatment Patient Details Name: Ronald AcresCharles Scinto MRN: [id number] DOB: 27-Oct-1917 Today's Date: 05/09/2013    History of Present Illness 80 year old with past medical history of dementia and atrial fibrillation he lives with his son. The morning of his admit his son found him on the ground unable to answer questions. His son related he usually uses a walker but he couldn't walk.    PT Comments    Pt was more amenable to working with PT likely due to returning to bed.  During transfer, pt appears fearful of falling  Or moving during transfers.  Hopefully pt can get therapies at SNF more regularly than on acute.   Follow Up Recommendations  SNF;Supervision/Assistance - 24 hour     Equipment Recommendations  Other (comment) (TBA)    Recommendations for Other Services       Precautions / Restrictions Precautions Precautions: Fall    Mobility  Bed Mobility Overal bed mobility: Needs Assistance Bed Mobility: Sit to Supine     Supine to sit: Max assist;+2 for safety/equipment Sit to supine: Max assist   General bed mobility comments: cues to guide;  assist to stabilize trunk and assist sith legs  Transfers Overall transfer level: Needs assistance Equipment used: None Transfers: Squat Pivot Transfers Sit to Stand: Total assist;+2 physical assistance   Squat pivot transfers: Total assist     General transfer comment: cues for hand placement; significant lifting asssit  Ambulation/Gait                 Stairs            Wheelchair Mobility    Modified Rankin (Stroke Patients Only)       Balance Overall balance assessment: Needs assistance Sitting-balance support: Feet supported;Single extremity supported Sitting balance-Leahy Scale: Poor       Standing balance-Leahy Scale: Zero                      Cognition Arousal/Alertness: Awake/alert Behavior During Therapy: Flat affect;Agitated Overall Cognitive Status: History of  cognitive impairments - at baseline                      Exercises Other Exercises Other Exercises: warm up hip/knee flexion/ext exercise    General Comments        Pertinent Vitals/Pain     Home Living                      Prior Function            PT Goals (current goals can now be found in the care plan section) Acute Rehab PT Goals PT Goal Formulation: With family Time For Goal Achievement: 05/09/13 Potential to Achieve Goals: Fair Progress towards PT goals: Not progressing toward goals - comment (unable to participate therapeutically)    Frequency  Min 2X/week    PT Plan Current plan remains appropriate    Co-evaluation             End of Session Equipment Utilized During Treatment: Gait belt Activity Tolerance: Patient tolerated treatment well Patient left: in bed;with call bell/phone within reach     Time: 4098-11911514-1525 PT Time Calculation (min): 11 min  Charges:  $Therapeutic Activity: 8-22 mins                    G CodesEliseo Gum:      Johanna Stafford V Shaunda Tipping 05/09/2013, 3:43 PM  05/09/2013  [place] BingKen Tamar Lipscomb, PT  [phone]  (pager)

## 2013-05-09 NOTE — Progress Notes (Signed)
Physical Therapy Treatment Patient Details Name: Stephenie AcresCharles Prohaska MRN: [id number] DOB: February 01, 1917 Today's Date: 05/09/2013    History of Present Illness 80 year old with past medical history of dementia and atrial fibrillation he lives with his son. The morning of his admit his son found him on the ground unable to answer questions. His son related he usually uses a walker but he couldn't walk.    PT Comments    Needs to be guided through tasks with max to total assist.  Often pt is resistant to movement  Follow Up Recommendations  SNF;Supervision/Assistance - 24 hour     Equipment Recommendations  Other (comment) (TBA)    Recommendations for Other Services       Precautions / Restrictions Precautions Precautions: Fall    Mobility  Bed Mobility Overal bed mobility: Needs Assistance Bed Mobility: Supine to Sit     Supine to sit: Max assist;+2 for safety/equipment     General bed mobility comments: pt still not following commands or assisting purposefull.  Needed significant truncal assist  Transfers Overall transfer level: Modified independent Equipment used: None Transfers: Sit to/from [employer] CorporationStand;Squat Pivot Transfers Sit to Stand: Total assist;+2 physical assistance   Squat pivot transfers: Max assist;+2 physical assistance     General transfer comment: cues for direction, significant transfer assist  Ambulation/Gait                 Stairs            Wheelchair Mobility    Modified Rankin (Stroke Patients Only)       Balance Overall balance assessment: Needs assistance Sitting-balance support: Feet supported;Single extremity supported Sitting balance-Leahy Scale: Poor       Standing balance-Leahy Scale: Zero                      Cognition Arousal/Alertness: Awake/alert   Overall Cognitive Status: History of cognitive impairments - at baseline                      Exercises Other Exercises Other Exercises: warm up  hip/knee flexion/ext exercise    General Comments        Pertinent Vitals/Pain     Home Living                      Prior Function            PT Goals (current goals can now be found in the care plan section) Acute Rehab PT Goals PT Goal Formulation: With family Time For Goal Achievement: 05/09/13 Potential to Achieve Goals: Fair Progress towards PT goals: Not progressing toward goals - comment (due to unable to follow commands and cognition)    Frequency  Min 2X/week    PT Plan Current plan remains appropriate    Co-evaluation             End of Session Equipment Utilized During Treatment: Gait belt Activity Tolerance: Patient tolerated treatment well Patient left: in chair;with call bell/phone within reach;with chair alarm set     Time: 4098-11911358-1422 PT Time Calculation (min): 24 min  Charges:  $Therapeutic Activity: 23-37 mins                    G CodesEliseo Gum:      Nalanie Winiecki V Nishita Isaacks 05/09/2013, 3:34 PM 05/09/2013  [place] BingKen Winifred Balogh, PT [phone]  (pager)

## 2013-05-09 NOTE — Progress Notes (Signed)
Progress Note  Ronald Prince ZOX:[id number] DOB: [birth year] DOA: 04/30/2013 PCP: Elvina SidleLAUENSTEIN,KURT, MD    Admit HPI / Brief Narrative: 80 year old with past medical history of dementia and atrial fibrillation he lives with his son. He started vomiting on the day prior to admission before he went to bed. The morning of his admit when his son got up he found him on the floor unable to answer questions. His son related he usually uses a walker but he couldn't walk. He remained confused most of the day but had no fever chills or cough.   In the ED:  After normal saline he became more responsive but was not making any sense. An ABG was done that showed a pH of 7.28 PCO2 of 34 PO2 of 156 on BiPAP. CBC showed a white count of 13 with a left shift - chest x-ray showed L lower lobe infiltrate.  HPI/Subjective: Opens eyes Unable to understand what he is saying   Assessment/Plan:  Hypoxic Respiratory failure, acute / LLL CAP  -Unclear if this represents community acquired pneumonia or possibly an aspiration event -empiric antibiotics and follow for clinical improvement - stop date for anbx's 4/14 -MRI negative for CVA  Sepsis (white blood cell 13.2, HR 96, CAP) Sepsis physiology resolved - continue supportive care  AKI  -Likely simple prerenal azotemia - Scr stable and BUN trend down  Hypokalemia -prn KCL per tube  Toxic metabolic encephalopathy in setting of chronic Dementia  - MRI 4/12 negative for cause of persistent AMS - Dr. Joseph ArtWoods d/w son Cyndia Diver(Tim Deitrick) and baseline MS pre admit was ability to communicate with family, and perform his daily ADLs  -failed SLP eval- palliative care consult -NG tube in place- start tube feeds 4/14 MRI negative  HTN  Iron def anemia -replete iron per tube  Possible UTI -UA not normal - could be related to DH/CAP - urine cx 4/10 no growth  Possible early ileus/?? pneumoperitoneum -? Of free air on initial CXR but CT done in ED confirmed NO FREE AIR-    -watch for recurrence of ileus sx's -add suppository -lactulose per tube  Nodules in B kidneys -indeterminate via CT scan - given advanced age would NOT pursue further work up  Code Status: DO NOT RESUSCITATE Family Communication:  Spoke with son Disposition Plan: ? Goals of care with palliative care  Consultants: None  Procedures: None  Antibiotics: Azithromycin 4/7>> Cefepime 4/7 Ceftriaxone 4/7>> Flagyl 4/7 Vancomycin 4/7  DVT prophylaxis: SQ heparin  Objective: Blood pressure 140/65, pulse 71, temperature 97.6 F (36.4 C), temperature source Axillary, resp. rate 18, height 5\' 8"  (1.727 m), weight 65.545 kg (144 lb 8 oz), SpO2 99.00%.  Intake/Output Summary (Last 24 hours) at 05/09/13 0855 Last data filed at 05/09/13 0130  Gross per 24 hour  Intake 1022.5 ml  Output    800 ml  Net  222.5 ml   Exam: General: No acute respiratory distress Lungs: CTA -  oxygen at 2L Cardiovascular: Regular rate and rhythm without murmur gallop or rub Abdomen: Nontender, nondistended, soft, bowel sounds positive, no rebound, no ascites, no appreciable mass Extremities: No significant cyanosis, clubbing, or edema bilateral lower extremities  Data Reviewed: Basic Metabolic Panel:  Recent Labs Lab 05/03/13 0402 05/04/13 1257 05/07/13 0332 05/08/13 0502  NA 142 136* 138 138  K 3.4* 3.5* 3.9 4.1  CL 106 100 103 104  CO2 22 24 21 24   GLUCOSE 84 125* 89 101*  BUN 19 12 8 12   CREATININE 0.82  0.77 0.72 0.76  CALCIUM 8.2* 8.3* 8.3* 8.4  MG  --  1.8  1.8  --  1.9  PHOS  --   --  2.7  --    Liver Function Tests:  Recent Labs Lab 05/04/13 1257  AST 21  ALT 14  ALKPHOS 57  BILITOT 0.3  PROT 5.5*  ALBUMIN 2.6*   CBC:  Recent Labs Lab 05/03/13 0402 05/04/13 1257 05/07/13 0332 05/08/13 0502  WBC 7.0 4.6 6.9 8.4  NEUTROABS  --  3.1  --   --   HGB 8.9* 9.6* 10.0* 9.8*  HCT 25.8* 27.3* 28.4* 28.1*  MCV 96.6 94.8 94.4 95.9  PLT 184 201 245 288   Cardiac  Enzymes: No results found for this basename: CKTOTAL, CKMB, CKMBINDEX, TROPONINI,  in the last 168 hours BNP (last 3 results)  Recent Labs  04/30/13 0800  PROBNP 523.6*    Recent Results (from the past 240 hour(s))  CULTURE, BLOOD (ROUTINE X 2)     Status: None   Collection Time    04/30/13  8:00 AM      Result Value Ref Range Status   Specimen Description BLOOD RIGHT ANTECUBITAL   Final   Special Requests BOTTLES DRAWN AEROBIC AND ANAEROBIC   Final   Culture  Setup Time     Final   Value: 04/30/2013 13:31     Performed at [facility]   Culture     Final   Value: NO GROWTH 5 DAYS     Performed at [facility]   Report Status 05/06/2013 FINAL   Final  MRSA PCR SCREENING     Status: None   Collection Time    04/30/13  4:05 PM      Result Value Ref Range Status   MRSA by PCR NEGATIVE  NEGATIVE Final   Comment:            The GeneXpert MRSA Assay (FDA     approved for NASAL specimens     only), is one component of a     comprehensive MRSA colonization     surveillance program. It is not     intended to diagnose MRSA     infection nor to guide or     monitor treatment for     MRSA infections.  CULTURE, BLOOD (ROUTINE X 2)     Status: None   Collection Time    04/30/13  5:15 PM      Result Value Ref Range Status   Specimen Description BLOOD RIGHT HAND   Final   Special Requests BOTTLES DRAWN AEROBIC ONLY 5CC   Final   Culture  Setup Time     Final   Value: 04/30/2013 21:17     Performed at [facility]   Culture     Final   Value: NO GROWTH 5 DAYS     Performed at [facility]   Report Status 05/06/2013 FINAL   Final  URINE CULTURE     Status: None   Collection Time    05/03/13  8:34 AM      Result Value Ref Range Status   Specimen Description URINE, RANDOM   Final   Special Requests NONE   Final   Culture  Setup Time     Final   Value: 05/03/2013 09:18     Performed at [facility] Count     Final   Value:  NO GROWTH  Performed at [facility]   Culture     Final   Value: NO GROWTH     Performed at [facility]   Report Status 05/04/2013 FINAL   Final       Scheduled Meds:  Scheduled Meds: . ferrous sulfate  300 mg Per Tube BID WC  . heparin  5,000 Units Subcutaneous 3 times per day  . metoprolol  5 mg Intravenous 4 times per day  . polyethylene glycol  17 g Oral Daily  . sodium chloride  3 mL Intravenous Q12H    Time spent on care of this patient: 35 mins  Joseph ArtJessica U [place] DO [phone] [facility]  If 7PM-7AM, please contact night-coverage [email or web address] Password [email or web address] 05/09/2013, 8:55 AM   LOS: 9 days

## 2013-05-10 LAB — GLUCOSE, CAPILLARY
GLUCOSE-CAPILLARY: 119 mg/dL — AB (ref 70–99)
GLUCOSE-CAPILLARY: 126 mg/dL — AB (ref 70–99)
Glucose-Capillary: 109 mg/dL — ABNORMAL HIGH (ref 70–99)

## 2013-05-10 LAB — METHYLMALONIC ACID, SERUM: Methylmalonic Acid, Quantitative: 114 nmol/L (ref 87–318)

## 2013-05-10 MED ORDER — POLYETHYLENE GLYCOL 3350 17 G PO PACK: 17.0000 g | PACK | Freq: Every day | ORAL | Status: AC | PRN

## 2013-05-10 MED ORDER — BISACODYL 10 MG RE SUPP: 10.0000 mg | Freq: Every day | RECTAL | Status: AC | PRN

## 2013-05-10 MED ORDER — LACTULOSE 10 GM/15ML PO SOLN: 20.0000 g | Freq: Two times a day (BID) | ORAL | Status: AC | PRN

## 2013-05-10 MED ORDER — FLEET ENEMA 7-19 GM/118ML RE ENEM: 1.0000 | ENEMA | Freq: Every day | RECTAL | Status: AC | PRN

## 2013-05-10 MED ORDER — STARCH (THICKENING) PO POWD: ORAL | Status: AC

## 2013-05-10 NOTE — Progress Notes (Addendum)
Ronald Prince to be D/C'd Skilled nursing facility: [facility] per MD order.  Discussed with the patient and all questions fully answered.  VSS, Skin clean, dry and intact without evidence of skin break down. Small abrasion to left ankle. IV catheter discontinued intact. Site without signs and symptoms of complications. Dressing and pressure applied.  Family transported all belongings.  Patient instructed to return to ED, call 911, or call MD for any changes in condition.   Patient escorted via stretcher by EMS.  Report called to nurse at [facility].  Ronald Prince 05/10/2013 2:55 PM

## 2013-05-10 NOTE — Discharge Summary (Addendum)
Physician Discharge Summary  Ronald Prince DQQ:[id number] DOB: Oct 04, 1917 DOA: 04/30/2013  PCP: Robyn Haber, MD  Admit date: 04/30/2013 Discharge date: 05/10/2013  Time spent: 35 minutes  Recommendations for Outpatient Follow-up:  1. DNR 2. Most form complete Comfort feeds: remain upright 45 min after meals, thickener -avoid rehospitalization palliative care to follow at SNF Wean O2 as tolerated  Discharge Diagnoses:  Principal Problem:   Respiratory failure, acute Active Problems:   CAP (community acquired pneumonia)   Severe sepsis   AKI (acute kidney injury)   Toxic encephalopathy   Protein-calorie malnutrition, severe   Anemia   Pneumoperitoneum   Palliative care encounter   Discharge Condition: stable  Diet recommendation: DYS 1, nectar thick  Filed Weights   05/08/13 0539 05/09/13 0513 05/10/13 0828  Weight: 65.2 kg (143 lb 11.8 oz) 65.545 kg (144 lb 8 oz) 68.493 kg (151 lb)    History of present illness:  80 year old with past medical history of dementia and atrial fibrillation he lives with his son and most of the history was obtained by him as the patient is confused. He started on the day prior to admission before he went to bed that he started vomiting. His morning when his son got up to the bathroom and found him on the ground unable to answer questions. His son relates is usually uses a walker but he couldn't walk. Been confused most of the day no fever chills or cough.  In the ED:  Generally of normal saline and he is more responsive but not making any sense. An ABG was done that showed a pH of 7.28 PCO2 of 34 PO2 of 156 on BiPAP. Metabolic panel was done that shows no acute kidney injury I think acid was 10.6 a CBC shows a white count of 13 with a left shift chest x-ray shows right lower lobe infiltrates we're consulted for further evaluation.   Hospital Course:  Family met with palliative care:  MOST form: DNR, comfort measures (no hospital readmission),  determine use or limitation of antibiotics, no IV fluids, no feeding tube. They wish to have palliative care services follow at SNF to help them connect with hospice and achieve goals as instructed on MOST form  Hypoxic Respiratory failure, acute / LLL CAP  -Unclear if this represents community acquired pneumonia or possibly an aspiration event -empiric antibiotics and follow for clinical improvement - stop date for abx's 4/14  -MRI negative for CVA   Sepsis (white blood cell 13.2, HR 96, CAP)  Sepsis physiology resolved - continue supportive care   AKI  -Likely simple prerenal azotemia - Scr stable and BUN trend down   Hypokalemia  -repleted  Toxic metabolic encephalopathy in setting of chronic Dementia  - MRI 4/12 negative for cause of persistent AMS - Dr. Sherral Hammers d/w son Ronald Prince) and baseline MS pre admit was ability to communicate with family, and perform his daily ADLs  -failed SLP eval- palliative care consult  -NG tube in place- start tube feeds 4/14  MRI negative   HTN   Iron def anemia  -replete iron  Possible UTI  -UA not normal - could be related to DH/CAP - urine cx 4/10 no growth   Nodules in B kidneys  -indeterminate via CT scan - given advanced age would NOT pursue further work up   Procedures:    Consultations:  Palliative care  Discharge Exam: Filed Vitals:   05/10/13 0500  BP: 133/60  Pulse: 62  Temp: 98.5 F (36.9 C)  Resp: 16    General: awake, able to answer simple questions Cardiovascular: rrr Respiratory: diminished  Discharge Instructions You were cared for by a hospitalist during your hospital stay. If you have any questions about your discharge medications or the care you received while you were in the hospital after you are discharged, you can call the unit and asked to speak with the hospitalist on call if the hospitalist that took care of you is not available. Once you are discharged, your primary care physician will handle any  further medical issues. Please note that NO REFILLS for any discharge medications will be authorized once you are discharged, as it is imperative that you return to your primary care physician (or establish a relationship with a primary care physician if you do not have one) for your aftercare needs so that they can reassess your need for medications and monitor your lab values.      Discharge Orders   Future Orders Complete By Expires   Discharge instructions  As directed    Increase activity slowly  As directed        Medication List         amLODipine 5 MG tablet  Commonly known as:  NORVASC  Take 5 mg by mouth daily.     aspirin 81 MG tablet  Take 81 mg by mouth daily.     bisacodyl 10 MG suppository  Commonly known as:  DULCOLAX  Place 1 suppository (10 mg total) rectally daily as needed for moderate constipation.     fish oil-omega-3 fatty acids 1000 MG capsule  Take 1 g by mouth daily.     food thickener Powd  Commonly known as:  THICK IT  As needed     lactulose 10 GM/15ML solution  Commonly known as:  CHRONULAC  Take 30 mLs (20 g total) by mouth 2 (two) times daily as needed for mild constipation.     multivitamin tablet  Take 1 tablet by mouth daily.     polyethylene glycol packet  Commonly known as:  MIRALAX / GLYCOLAX  Take 17 g by mouth daily as needed for mild constipation or moderate constipation.     SLOW RELEASE IRON 45 MG Tbcr  Generic drug:  Ferrous Sulfate Dried  Take 40 mg by mouth daily.     sodium phosphate 7-19 GM/118ML Enem  Place 133 mLs (1 enema total) rectally daily as needed for severe constipation.       Allergies  Allergen Reactions  . Penicillins       The results of significant diagnostics from this hospitalization (including imaging, microbiology, ancillary and laboratory) are listed below for reference.    Significant Diagnostic Studies: Ct Abdomen Pelvis Wo Contrast  04/30/2013   ADDENDUM REPORT: 04/30/2013 10:12   ADDENDUM: There is no CT evidence of pneumoperitoneum.  Chiliaditic anatomy appreciated.   Electronically Signed   By: Margaree Mackintosh M.D.   On: 04/30/2013 10:12   04/30/2013   CLINICAL DATA:  Shortness breath, history of recent fall  EXAM: CT ABDOMEN AND PELVIS WITHOUT CONTRAST  TECHNIQUE: Multidetector CT imaging of the abdomen and pelvis was performed following the standard protocol without intravenous contrast.  COMPARISON:  None.  FINDINGS: Areas of increased density are appreciated within the lung bases left greater than right. Areas of consolidation appreciated on the left.  The stomach is moderately dilated and fluid-filled.  Noncontrast evaluation of the liver, spleen, adrenals, pancreas is unremarkable.  Portions of the proximal and transverse  colon are interposed between the liver and the anterior abdominal wall.  Small low attenuating exophytic nodules are appreciated involving the right and left kidneys. The largest is on the left measuring 1.5 cm in diameter. Hounsfield units of these regions are consistent with fluid and these areas likely represent cysts. Further evaluation with renal ultrasound for further characterization is recommended. Noncontrast evaluation kidney is otherwise unremarkable.  A mild-to-moderate amount of stool is appreciated within the colon. Multiple fluid-filled loops of nondilated small bowel are appreciated primarily within the left side of the abdomen and pelvis. Otherwise no evidence of bowel obstruction. The appendix is identified and is unremarkable.  Within the limitations of a noncontrast CT no abdominal or pelvic masses, free fluid, loculated fluid collections, nor adenopathy.  No abdominal aortic aneurysm. Scattered atherosclerotic calcifications appreciated.  Degenerative changes are appreciated within the hips and lumbar spine, without aggressive appearing osseous lesions.  No abdominal wall hernia appreciated. A small fat containing inguinal hernia on the left.   IMPRESSION: 1. Dilated fluid-filled stomach and multiple fluid-filled loops of small bowel. The bowel pattern presently is nonobstructive with no an evolving ileus cannot be excluded. Surveillance evaluation with plain film abdominal radiograph recommended. 2. Indeterminate nodules in the kidneys these findings may possibly be further characterized with ultrasound 3. Findings concerning for left lower lobe infiltrate. Component of atelectasis within the lung bases also a diagnostic consideration. 4. Small fat containing inguinal hernia on the left without further focal or acute abnormalities.  Electronically Signed: By: Margaree Mackintosh M.D. On: 04/30/2013 09:17   Dg Abd 1 View  05/06/2013   CLINICAL DATA:  Feet tube replacement. Prior feeding tube pulled by the patient.  EXAM: ABDOMEN - 1 VIEW  COMPARISON:  05/06/2013.  FLUOROSCOPY TIME:  6 min and 23 seconds.  FINDINGS: A metallic tipped feeding tube is present with tip distal to the ligament of Treitz in the proximal jejunum. Small volume of contrast was injected via the tube confirming proper placement.  IMPRESSION: 1. Tip of feeding tube is beyond the ligament of Treitz within the proximal jejunum.   Electronically Signed   By: Vinnie Langton M.D.   On: 05/06/2013 14:58   Dg Abd 1 View  05/06/2013   CLINICAL DATA:  80 year old male for fluoroscopic placement of feeding tube. Initial encounter.  EXAM: ABDOMEN - 1 VIEW  COMPARISON:  CT Abdomen and Pelvis 04/30/2013.  FLUOROSCOPY TIME:  5 min and 0 seconds.  FINDINGS: Cone-down fluoroscopic image of the mid abdomen demonstrating and enteric tube with injected contrast extending through the duodenum C loop. Tip located just be on the ligament of Treitz. Contrast injection demonstrating normal small bowel mucosa.  IMPRESSION: Feeding tube tip placed just be on the ligament of Treitz with fluoroscopy.   Electronically Signed   By: Lars Pinks M.D.   On: 05/06/2013 11:07   Ct Head Wo Contrast  04/30/2013    CLINICAL DATA:  Found on floor  EXAM: CT HEAD WITHOUT CONTRAST  TECHNIQUE: Contiguous axial images were obtained from the base of the skull through the vertex without intravenous contrast.  COMPARISON:  None.  FINDINGS: Generalized atrophy. Microvascular ischemic changes are present bilaterally which appear chronic.  Negative for acute infarct. Negative for hemorrhage mass or skull fracture.  Air-fluid levels in the maxillary sinus bilaterally. Mild atherosclerotic calcification in the cavernous carotid.  IMPRESSION: Atrophy and chronic microvascular ischemia.  No acute abnormality.   Electronically Signed   By: Franchot Gallo M.D.   On:  04/30/2013 09:05   Mr Jodene Nam Head Wo Contrast  05/06/2013   CLINICAL DATA:  Acute mental status change without resolution. Unable to walk. Increased confusion.  EXAM: MRI HEAD WITHOUT CONTRAST  MRA HEAD WITHOUT CONTRAST  TECHNIQUE: Multiplanar, multiecho pulse sequences of the brain and surrounding structures were obtained without intravenous contrast. Angiographic images of the head were obtained using MRA technique without contrast.  COMPARISON:  CT head without contrast 04/30/2013  FINDINGS: MRI HEAD FINDINGS  The diffusion-weighted images demonstrate no evidence for acute or subacute infarction. The diffusion-weighted images demonstrate no evidence for acute or subacute infarction.  Mild to moderate generalized atrophy and white matter disease is present bilaterally. The ventricles are proportionate to the degree of atrophy. No significant extra-axial fluid collection is present.  Flow is present in the major intracranial arteries. The globes and orbits are intact. The paranasal sinuses are clear. There is some fluid in the mastoid air cells bilaterally. No obstructing nasopharyngeal lesion is evident.  MRA HEAD FINDINGS  The study is mildly degraded by patient motion. Internal carotid arteries are within normal limits from the high cervical segments through the ICA termini  bilaterally. The A1 and M1 segments normal. The anterior communicating artery is patent. The MCA bifurcations are intact. There is moderate attenuation of MCA branch vessels bilaterally without a significant proximal occlusion.  The vertebral arteries are codominant. The vertebrobasilar junction is within normal limits. The right PICA origin is visualized and normal. Both posterior cerebral arteries originate from the basilar tip. There is some attenuation of distal PCA branch vessels bilaterally.  IMPRESSION: 1. No acute intracranial abnormality. 2. Moderate generalized atrophy head white matter disease likely reflects the sequela of chronic microvascular ischemia. 3. The MRA demonstrates moderate small vessel disease. No significant proximal stenosis, aneurysm, or branch vessel occlusion is present.   Electronically Signed   By: Lawrence Santiago M.D.   On: 05/06/2013 09:50   Mr Brain Wo Contrast  05/06/2013   CLINICAL DATA:  Acute mental status change without resolution. Unable to walk. Increased confusion.  EXAM: MRI HEAD WITHOUT CONTRAST  MRA HEAD WITHOUT CONTRAST  TECHNIQUE: Multiplanar, multiecho pulse sequences of the brain and surrounding structures were obtained without intravenous contrast. Angiographic images of the head were obtained using MRA technique without contrast.  COMPARISON:  CT head without contrast 04/30/2013  FINDINGS: MRI HEAD FINDINGS  The diffusion-weighted images demonstrate no evidence for acute or subacute infarction. The diffusion-weighted images demonstrate no evidence for acute or subacute infarction.  Mild to moderate generalized atrophy and white matter disease is present bilaterally. The ventricles are proportionate to the degree of atrophy. No significant extra-axial fluid collection is present.  Flow is present in the major intracranial arteries. The globes and orbits are intact. The paranasal sinuses are clear. There is some fluid in the mastoid air cells bilaterally. No  obstructing nasopharyngeal lesion is evident.  MRA HEAD FINDINGS  The study is mildly degraded by patient motion. Internal carotid arteries are within normal limits from the high cervical segments through the ICA termini bilaterally. The A1 and M1 segments normal. The anterior communicating artery is patent. The MCA bifurcations are intact. There is moderate attenuation of MCA branch vessels bilaterally without a significant proximal occlusion.  The vertebral arteries are codominant. The vertebrobasilar junction is within normal limits. The right PICA origin is visualized and normal. Both posterior cerebral arteries originate from the basilar tip. There is some attenuation of distal PCA branch vessels bilaterally.  IMPRESSION: 1. No acute  intracranial abnormality. 2. Moderate generalized atrophy head white matter disease likely reflects the sequela of chronic microvascular ischemia. 3. The MRA demonstrates moderate small vessel disease. No significant proximal stenosis, aneurysm, or branch vessel occlusion is present.   Electronically Signed   By: Lawrence Santiago M.D.   On: 05/06/2013 09:50   Dg Chest Port 1 View  05/06/2013   CLINICAL DATA:  Shortness of breath, congestion  EXAM: PORTABLE CHEST - 1 VIEW  COMPARISON:  DG CHEST 1V PORT dated 04/30/2013; CT ABD/PELV WO CM dated 04/30/2013  FINDINGS: Bilateral diffuse interstitial thickening and peribronchial cuffing most concerning for bronchitis. There is mild persistent left lower lobe and retrocardiac airspace disease. No pneumothorax. The heart and mediastinal contours are unremarkable. There is aortic arch atherosclerosis.  The osseous structures are unremarkable.  IMPRESSION: 1. Persistent left lower lobe airspace disease concerning for pneumonia. 2. Bilateral interstitial thickening and peribronchial cuffs concerning for bronchitis.   Electronically Signed   By: Kathreen Devoid   On: 05/06/2013 01:39   Dg Chest Portable 1 View  04/30/2013   CLINICAL DATA:   Shortness of breath.  Former smoker.  Dementia.  EXAM: PORTABLE CHEST - 1 VIEW  COMPARISON:  None.  FINDINGS: Pneumoperitoneum localizing beneath the right hemidiaphragm. Cardiac silhouette normal in size. Thoracic aorta mildly atherosclerotic. Hilar and mediastinal contours otherwise unremarkable. Airspace consolidation in the retrocardiac left lung base. Lungs otherwise clear. Pulmonary vascularity normal. No pneumothorax. No pleural effusions.  IMPRESSION: 1. Pneumoperitoneum. 2. Left lower lobe pneumonia. Critical v patent the alue/emergent results were called by telephone at the time of interpretation on 04/30/2013 at 7:57 AM to Dr. Virgel Manifold, who verbally acknowledged these results.   Electronically Signed   By: Evangeline Dakin M.D.   On: 04/30/2013 08:02   Dg Loyce Dys Tube Plc W/fl-no Rad  05/06/2013   CLINICAL DATA: repleace feeding tube   NASO G TUBE PLACEMENT WITH FLUORO  Fluoroscopy was utilized by the requesting physician.  No radiographic  interpretation.    Dg Naso G Tube Plc W/fl-no Rad  05/06/2013   CLINICAL DATA: Severe dysphagia; aspiration pneumonia   NASO G TUBE PLACEMENT WITH FLUORO  Fluoroscopy was utilized by the requesting physician.  No radiographic  interpretation.    Dg Swallowing Func-speech Pathology  05/03/2013   Katherene Ponto Deblois, CCC-SLP     05/03/2013 11:39 AM Objective Swallowing Evaluation: Modified Barium Swallowing Study   Patient Details  Name: Ion Gonnella MRN: [id number] Date of Birth: [birth year]  Today's Date: 05/03/2013 Time: 3546-5681 SLP Time Calculation (min): 17 min  Past Medical History:  Past Medical History  Diagnosis Date  . Atrial fibrillation   . Dementia   . Hypertension   . Shortness of breath   . Pneumonia 04/2013  . GERD (gastroesophageal reflux disease)    Past Surgical History:  Past Surgical History  Procedure Laterality Date  . Cataract extraction     HPI:  80 year old with past medical history of dementia and atrial  fibrillation he lives with  his son and most of the history was  obtained by him as the patient was confused. He started vomiting  on the day prior to admission before he went to bed. The morning  of his admit when his son got up to the bathroom he found him on  the ground unable to answer questions. His son related he usually  uses a walker but he couldn't walk. He remained confused most of  the day but had no fever  chills or cough. In the ED: After normal  saline he became more responsive but was not making any sense.  Chest x-ray showed L lower lobe infiltrate. MD reports sepsis.     Assessment / Plan / Recommendation Clinical Impression  Dysphagia Diagnosis: Severe oral phase dysphagia;Severe  pharyngeal phase dysphagia Clinical impression: Pt demonstrates a severe oral dyspahgia with  weak lingual manipulation and pumping behavior to transit bolus,  leaving a moderate amount of oral residuals, mixing with  secretions in primarily left buccal cavity. There is premature  spillage to the valleculae with liquids pooling there (pt  slightly forward leaning during test, suspect he would  immediately aspirate liquids when lying slightly posteriorally in  bed).   With all liquid trials, despite thickness or bolus size pt has  some spillage into the airway before the swallow. Though airway  closure is adequate and penetrates are often expelled during the  swallow, signfiicant base of tongue weakness and hyolaryngeal  weakness lead to vallecular and pyriform sinus residuals that are  aspirated post swallow. Even purees pose high risk as residuals  mix with secretions. A chin tuck does decrease residue, but pt  would not be reliable.   Overall, pts dysphagia is severe and risk of aspiration with all  textures is high. MD reports pt is not expected to have rapid  significant recovery of function, so discussion regarding goals  of care is warranted. For now pt should remain NPO, SLP will  follow for trials of puree with attempted compensatory   strategies.     Treatment Recommendation  Therapy as outlined in treatment plan below    Diet Recommendation NPO   Medication Administration: Via alternative means    Other  Recommendations Recommended Consults: MBS Oral Care Recommendations: Oral care Q4 per protocol   Follow Up Recommendations       Frequency and Duration min 2x/week  2 weeks   Pertinent Vitals/Pain NA    SLP Swallow Goals     General HPI: 80 year old with past medical history of dementia  and atrial fibrillation he lives with his son and most of the  history was obtained by him as the patient was confused. He  started vomiting on the day prior to admission before he went to  bed. The morning of his admit when his son got up to the bathroom  he found him on the ground unable to answer questions. His son  related he usually uses a walker but he couldn't walk. He  remained confused most of the day but had no fever chills or  cough. In the ED: After normal saline he became more responsive  but was not making any sense. Chest x-ray showed L lower lobe  infiltrate. Type of Study: Modified Barium Swallowing Study Reason for Referral: Objectively evaluate swallowing function Previous Swallow Assessment: none Diet Prior to this Study: NPO Temperature Spikes Noted: No Respiratory Status: Nasal cannula History of Recent Intubation: No Behavior/Cognition: Alert;Cooperative;Requires cueing;Confused Oral Cavity - Dentition: Missing dentition;Poor condition Oral Motor / Sensory Function: Impaired motor;Impaired sensory Oral impairment: Left facial;Left labial Self-Feeding Abilities: Needs assist Patient Positioning: Upright in chair Baseline Vocal Quality: Clear Volitional Cough: Strong Volitional Swallow: Able to elicit Pharyngeal Secretions: Not observed secondary MBS    Reason for Referral Objectively evaluate swallowing function   Oral Phase Oral Preparation/Oral Phase Oral Phase: Impaired Oral - Honey Oral - Honey Teaspoon: Left anterior bolus loss;Weak  lingual  manipulation;Lingual pumping;Incomplete tongue to palate  contact;Reduced posterior propulsion;Left  pocketing in lateral  sulci;Lingual/palatal residue;Delayed oral transit Oral - Nectar Oral - Nectar Teaspoon: Left anterior bolus loss;Weak lingual  manipulation;Lingual pumping;Incomplete tongue to palate  contact;Reduced posterior propulsion;Left pocketing in lateral  sulci;Lingual/palatal residue;Delayed oral transit Oral - Nectar Cup: Left anterior bolus loss;Weak lingual  manipulation;Lingual pumping;Incomplete tongue to palate  contact;Reduced posterior propulsion;Left pocketing in lateral  sulci;Lingual/palatal residue;Delayed oral transit Oral - Nectar Straw: Left anterior bolus loss;Weak lingual  manipulation;Lingual pumping;Incomplete tongue to palate  contact;Reduced posterior propulsion;Left pocketing in lateral  sulci;Lingual/palatal residue;Delayed oral transit Oral - Thin Oral - Thin Teaspoon: Left anterior bolus loss;Weak lingual  manipulation;Lingual pumping;Incomplete tongue to palate  contact;Reduced posterior propulsion;Left pocketing in lateral  sulci;Lingual/palatal residue;Delayed oral transit Oral - Solids Oral - Puree: Left anterior bolus loss;Weak lingual  manipulation;Lingual pumping;Incomplete tongue to palate  contact;Reduced posterior propulsion;Left pocketing in lateral  sulci;Lingual/palatal residue;Delayed oral transit   Pharyngeal Phase Pharyngeal Phase Pharyngeal Phase: Impaired Pharyngeal - Honey Pharyngeal - Honey Teaspoon: Delayed swallow initiation;Premature  spillage to pyriform sinuses;Reduced pharyngeal  peristalsis;Reduced epiglottic inversion;Reduced tongue base  retraction;Penetration/Aspiration before  swallow;Penetration/Aspiration after swallow;Moderate  aspiration;Pharyngeal residue - valleculae Penetration/Aspiration details (honey teaspoon): Material enters  airway, CONTACTS cords and not ejected out;Material enters  airway, passes BELOW cords without  attempt by patient to eject  out (silent aspiration) Pharyngeal - Nectar Pharyngeal - Nectar Teaspoon: Delayed swallow  initiation;Premature spillage to pyriform sinuses;Reduced  pharyngeal peristalsis;Reduced epiglottic inversion;Reduced  tongue base retraction;Penetration/Aspiration before  swallow;Penetration/Aspiration after swallow;Moderate  aspiration;Pharyngeal residue - valleculae Penetration/Aspiration details (nectar teaspoon): Material enters  airway, CONTACTS cords and not ejected out;Material enters  airway, passes BELOW cords without attempt by patient to eject  out (silent aspiration) Pharyngeal - Nectar Cup: Delayed swallow initiation;Premature  spillage to pyriform sinuses;Reduced pharyngeal  peristalsis;Reduced epiglottic inversion;Reduced tongue base  retraction;Penetration/Aspiration before  swallow;Penetration/Aspiration after swallow;Moderate  aspiration;Pharyngeal residue - valleculae Penetration/Aspiration details (nectar cup): Material enters  airway, CONTACTS cords and not ejected out;Material enters  airway, passes BELOW cords without attempt by patient to eject  out (silent aspiration) Pharyngeal - Nectar Straw: Delayed swallow initiation;Premature  spillage to pyriform sinuses;Reduced pharyngeal  peristalsis;Reduced epiglottic inversion;Reduced tongue base  retraction;Penetration/Aspiration before  swallow;Penetration/Aspiration after swallow;Moderate  aspiration;Pharyngeal residue - valleculae Penetration/Aspiration details (nectar straw): Material enters  airway, CONTACTS cords and not ejected out;Material enters  airway, passes BELOW cords without attempt by patient to eject  out (silent aspiration) Pharyngeal - Thin Pharyngeal - Thin Teaspoon: Delayed swallow initiation;Premature  spillage to pyriform sinuses;Reduced pharyngeal  peristalsis;Reduced epiglottic inversion;Reduced tongue base  retraction;Penetration/Aspiration before  swallow;Penetration/Aspiration after swallow;Moderate   aspiration;Pharyngeal residue - valleculae Penetration/Aspiration details (thin teaspoon): Material enters  airway, passes BELOW cords without attempt by patient to eject  out (silent aspiration) Pharyngeal - Solids Pharyngeal - Puree: Premature spillage to valleculae;Delayed  swallow initiation;Reduced epiglottic inversion;Reduced tongue  base retraction;Reduced anterior laryngeal mobility;Reduced  pharyngeal peristalsis;Penetration/Aspiration after  swallow;Pharyngeal residue - valleculae Penetration/Aspiration details (puree): Material enters airway,  remains ABOVE vocal cords and not ejected out  Cervical Esophageal Phase    GO    Cervical Esophageal Phase Cervical Esophageal Phase: [facility]        Herbie Baltimore, MA CCC-SLP [phone]  Katherene Ponto Deblois 05/03/2013, 11:35 AM     Microbiology: Recent Results (from the past 240 hour(s))  MRSA PCR SCREENING     Status: None   Collection Time    04/30/13  4:05 PM      Result Value Ref Range Status   MRSA by PCR NEGATIVE  NEGATIVE Final   Comment:            The GeneXpert MRSA Assay (FDA     approved for NASAL specimens     only), is one component of a     comprehensive MRSA colonization     surveillance program. It is not     intended to diagnose MRSA     infection nor to guide or     monitor treatment for     MRSA infections.  CULTURE, BLOOD (ROUTINE X 2)     Status: None   Collection Time    04/30/13  5:15 PM      Result Value Ref Range Status   Specimen Description BLOOD RIGHT HAND   Final   Special Requests BOTTLES DRAWN AEROBIC ONLY 5CC   Final   Culture  Setup Time     Final   Value: 04/30/2013 21:17     Performed at [facility]   Culture     Final   Value: NO GROWTH 5 DAYS     Performed at [facility]   Report Status 05/06/2013 FINAL   Final  URINE CULTURE     Status: None   Collection Time    05/03/13  8:34 AM      Result Value Ref Range Status   Specimen Description URINE, RANDOM   Final   Special  Requests NONE   Final   Culture  Setup Time     Final   Value: 05/03/2013 09:18     Performed at [place]     Final   Value: NO GROWTH     Performed at [facility]   Culture     Final   Value: NO GROWTH     Performed at [facility]   Report Status 05/04/2013 FINAL   Final     Labs: Basic Metabolic Panel:  Recent Labs Lab 05/04/13 1257 05/07/13 0332 05/08/13 0502  NA 136* 138 138  K 3.5* 3.9 4.1  CL 100 103 104  CO2 '24 21 24  ' GLUCOSE 125* 89 101*  BUN '12 8 12  ' CREATININE 0.77 0.72 0.76  CALCIUM 8.3* 8.3* 8.4  MG 1.8  1.8  --  1.9  PHOS  --  2.7  --    Liver Function Tests:  Recent Labs Lab 05/04/13 1257  AST 21  ALT 14  ALKPHOS 57  BILITOT 0.3  PROT 5.5*  ALBUMIN 2.6*   No results found for this basename: LIPASE, AMYLASE,  in the last 168 hours No results found for this basename: AMMONIA,  in the last 168 hours CBC:  Recent Labs Lab 05/04/13 1257 05/07/13 0332 05/08/13 0502  WBC 4.6 6.9 8.4  NEUTROABS 3.1  --   --   HGB 9.6* 10.0* 9.8*  HCT 27.3* 28.4* 28.1*  MCV 94.8 94.4 95.9  PLT 201 245 288   Cardiac Enzymes: No results found for this basename: CKTOTAL, CKMB, CKMBINDEX, TROPONINI,  in the last 168 hours BNP: BNP (last 3 results)  Recent Labs  04/30/13 0800  PROBNP 523.6*   CBG:  Recent Labs Lab 05/09/13 1604 05/09/13 1940 05/09/13 2355 05/10/13 0547 05/10/13 0755  GLUCAP 115* 99 122* 109* 119*       Signed:  Geradine Girt  [facility] 05/10/2013, 10:09 AM

## 2013-05-10 NOTE — Progress Notes (Signed)
  [facility]   Pt and family agreeable. Confirmed plans with facility.  Plan transfer via EMS.    Leron Croakassandra Perry [facility]  [facility]  [address];  [phone] Phone: [phone]

## 2013-05-10 NOTE — Consult Note (Signed)
I have reviewed this case with our NP and agree with the Assessment and Plan as stated.  Ulas Zuercher L. Ayodele Hartsock, MD MBA The Palliative Medicine Team at [place] Team Phone: [phone] Pager: [phone]   

## 2013-06-19 ENCOUNTER — Emergency Department (HOSPITAL_COMMUNITY)
Admission: EM | Admit: 2013-06-19 | Discharge: 2013-06-19 | Disposition: A | Discharge status: Skilled Nursing Facility | Payer: Medicare Other | Attending: Emergency Medicine | Admitting: Emergency Medicine

## 2013-06-19 ENCOUNTER — Emergency Department (HOSPITAL_COMMUNITY): Payer: Medicare Other

## 2013-06-19 ENCOUNTER — Encounter (HOSPITAL_COMMUNITY): Payer: Self-pay | Admitting: Emergency Medicine

## 2013-06-19 DIAGNOSIS — Z880 Allergy status to penicillin: Secondary | ICD-10-CM | POA: Insufficient documentation

## 2013-06-19 DIAGNOSIS — IMO0002 Reserved for concepts with insufficient information to code with codable children: Secondary | ICD-10-CM | POA: Diagnosis present

## 2013-06-19 DIAGNOSIS — F0390 Unspecified dementia without behavioral disturbance: Secondary | ICD-10-CM | POA: Diagnosis present

## 2013-06-19 DIAGNOSIS — R269 Unspecified abnormalities of gait and mobility: Secondary | ICD-10-CM

## 2013-06-19 DIAGNOSIS — S0990XA Unspecified injury of head, initial encounter: Secondary | ICD-10-CM

## 2013-06-19 DIAGNOSIS — S0180XA Unspecified open wound of other part of head, initial encounter: Secondary | ICD-10-CM | POA: Insufficient documentation

## 2013-06-19 DIAGNOSIS — Y939 Activity, unspecified: Secondary | ICD-10-CM | POA: Insufficient documentation

## 2013-06-19 DIAGNOSIS — Z23 Encounter for immunization: Secondary | ICD-10-CM | POA: Insufficient documentation

## 2013-06-19 DIAGNOSIS — Z79899 Other long term (current) drug therapy: Secondary | ICD-10-CM | POA: Insufficient documentation

## 2013-06-19 DIAGNOSIS — T68XXXA Hypothermia, initial encounter: Secondary | ICD-10-CM | POA: Diagnosis present

## 2013-06-19 DIAGNOSIS — W050XXA Fall from non-moving wheelchair, initial encounter: Secondary | ICD-10-CM | POA: Insufficient documentation

## 2013-06-19 DIAGNOSIS — J189 Pneumonia, unspecified organism: Secondary | ICD-10-CM

## 2013-06-19 DIAGNOSIS — T148XXA Other injury of unspecified body region, initial encounter: Secondary | ICD-10-CM

## 2013-06-19 DIAGNOSIS — Z87891 Personal history of nicotine dependence: Secondary | ICD-10-CM | POA: Insufficient documentation

## 2013-06-19 DIAGNOSIS — D649 Anemia, unspecified: Secondary | ICD-10-CM | POA: Diagnosis present

## 2013-06-19 DIAGNOSIS — R259 Unspecified abnormal involuntary movements: Secondary | ICD-10-CM

## 2013-06-19 DIAGNOSIS — I10 Essential (primary) hypertension: Secondary | ICD-10-CM | POA: Insufficient documentation

## 2013-06-19 DIAGNOSIS — J159 Unspecified bacterial pneumonia: Secondary | ICD-10-CM | POA: Insufficient documentation

## 2013-06-19 DIAGNOSIS — R2681 Unsteadiness on feet: Secondary | ICD-10-CM | POA: Diagnosis present

## 2013-06-19 DIAGNOSIS — E43 Unspecified severe protein-calorie malnutrition: Secondary | ICD-10-CM | POA: Diagnosis present

## 2013-06-19 DIAGNOSIS — R251 Tremor, unspecified: Secondary | ICD-10-CM

## 2013-06-19 DIAGNOSIS — Z7982 Long term (current) use of aspirin: Secondary | ICD-10-CM | POA: Insufficient documentation

## 2013-06-19 DIAGNOSIS — Z8719 Personal history of other diseases of the digestive system: Secondary | ICD-10-CM | POA: Insufficient documentation

## 2013-06-19 DIAGNOSIS — Y9210 Unspecified residential institution as the place of occurrence of the external cause: Secondary | ICD-10-CM | POA: Insufficient documentation

## 2013-06-19 LAB — CBC WITH DIFFERENTIAL/PLATELET
BASOS ABS: 0 10*3/uL (ref 0.0–0.1)
Basophils Relative: 0 % (ref 0–1)
Eosinophils Absolute: 0.1 10*3/uL (ref 0.0–0.7)
Eosinophils Relative: 2 % (ref 0–5)
HCT: 27.5 % — ABNORMAL LOW (ref 39.0–52.0)
Hemoglobin: 9.4 g/dL — ABNORMAL LOW (ref 13.0–17.0)
LYMPHS PCT: 17 % (ref 12–46)
Lymphs Abs: 1.2 10*3/uL (ref 0.7–4.0)
MCH: 32.9 pg (ref 26.0–34.0)
MCHC: 34.2 g/dL (ref 30.0–36.0)
MCV: 96.2 fL (ref 78.0–100.0)
Monocytes Absolute: 0.4 10*3/uL (ref 0.1–1.0)
Monocytes Relative: 6 % (ref 3–12)
NEUTROS ABS: 5.1 10*3/uL (ref 1.7–7.7)
Neutrophils Relative %: 75 % (ref 43–77)
PLATELETS: 347 10*3/uL (ref 150–400)
RBC: 2.86 MIL/uL — ABNORMAL LOW (ref 4.22–5.81)
RDW: 13.5 % (ref 11.5–15.5)
WBC: 6.9 10*3/uL (ref 4.0–10.5)

## 2013-06-19 LAB — TROPONIN I: Troponin I: <0.3 ng/mL (ref <0.30)

## 2013-06-19 LAB — COMPREHENSIVE METABOLIC PANEL
ALT: 15 U/L (ref 0–53)
AST: 24 U/L (ref 0–37)
Albumin: 3 g/dL — ABNORMAL LOW (ref 3.5–5.2)
Alkaline Phosphatase: 77 U/L (ref 39–117)
BILIRUBIN TOTAL: 0.4 mg/dL (ref 0.3–1.2)
BUN: 21 mg/dL (ref 6–23)
CALCIUM: 9 mg/dL (ref 8.4–10.5)
CHLORIDE: 103 meq/L (ref 96–112)
CO2: 26 mEq/L (ref 19–32)
Creatinine, Ser: 0.86 mg/dL (ref 0.50–1.35)
GFR calc Af Amer: 84 mL/min — ABNORMAL LOW (ref >90)
GFR, EST NON AFRICAN AMERICAN: 72 mL/min — AB (ref >90)
Glucose, Bld: 101 mg/dL — ABNORMAL HIGH (ref 70–99)
Potassium: 4.2 mEq/L (ref 3.7–5.3)
SODIUM: 139 meq/L (ref 137–147)
Total Protein: 6.3 g/dL (ref 6.0–8.3)

## 2013-06-19 LAB — PROTIME-INR
INR: 1.04 (ref 0.00–1.49)
Prothrombin Time: 13.4 seconds (ref 11.6–15.2)

## 2013-06-19 MED ORDER — LEVOFLOXACIN IN D5W 500 MG/100ML IV SOLN
500.0000 mg | Freq: Once | INTRAVENOUS | Status: AC
  Administered 2013-06-19: 500 mg via INTRAVENOUS
  Filled 2013-06-19: qty 100

## 2013-06-19 MED ORDER — SODIUM CHLORIDE 0.9 % IV BOLUS (SEPSIS)
1000.0000 mL | Freq: Once | INTRAVENOUS | Status: AC
  Administered 2013-06-19: 1000 mL via INTRAVENOUS

## 2013-06-19 MED ORDER — LEVOFLOXACIN 750 MG PO TABS: 750.0000 mg | ORAL_TABLET | Freq: Every day | ORAL | Status: AC

## 2013-06-19 MED ORDER — TETANUS-DIPHTH-ACELL PERTUSSIS 5-2.5-18.5 LF-MCG/0.5 IM SUSP
0.5000 mL | Freq: Once | INTRAMUSCULAR | Status: AC
  Administered 2013-06-19: 0.5 mL via INTRAMUSCULAR
  Filled 2013-06-19: qty 0.5

## 2013-06-19 NOTE — ED Notes (Signed)
Bear hugger warming blanket removed from patient, temperature stabilized.

## 2013-06-19 NOTE — Discharge Instructions (Signed)
Head Injury, Adult Followup with your doctor for suture removal in one week. Take antibiotics for pneumonia as prescribed. Return to the ED if you develop new or worsening symptoms. You have received a head injury. It does not appear serious at this time. Headaches and vomiting are common following head injury. It should be easy to awaken from sleeping. Sometimes it is necessary for you to stay in the emergency department for a while for observation. Sometimes admission to the hospital may be needed. After injuries such as yours, most problems occur within the first 24 hours, but side effects may occur up to 7 10 days after the injury. It is important for you to carefully monitor your condition and contact your health care provider or seek immediate medical care if there is a change in your condition. WHAT ARE THE TYPES OF HEAD INJURIES? Head injuries can be as minor as a bump. Some head injuries can be more severe. More severe head injuries include:  A jarring injury to the brain (concussion).  A bruise of the brain (contusion). This mean there is bleeding in the brain that can cause swelling.  A cracked skull (skull fracture).  Bleeding in the brain that collects, clots, and forms a bump (hematoma). WHAT CAUSES A HEAD INJURY? A serious head injury is most likely to happen to someone who is in a car wreck and is not wearing a seat belt. Other causes of major head injuries include bicycle or motorcycle accidents, sports injuries, and falls. HOW ARE HEAD INJURIES DIAGNOSED? A complete history of the event leading to the injury and your current symptoms will be helpful in diagnosing head injuries. Many times, pictures of the brain, such as CT or MRI are needed to see the extent of the injury. Often, an overnight hospital stay is necessary for observation.  WHEN SHOULD I SEEK IMMEDIATE MEDICAL CARE?  You should get help right away if:  You have confusion or drowsiness.  You feel sick to your  stomach (nauseous) or have continued, forceful vomiting.  You have dizziness or unsteadiness that is getting worse.  You have severe, continued headaches not relieved by medicine. Only take over-the-counter or prescription medicines for pain, fever, or discomfort as directed by your health care provider.  You do not have normal function of the arms or legs or are unable to walk.  You notice changes in the black spots in the center of the colored part of your eye (pupil).  You have a clear or bloody fluid coming from your nose or ears.  You have a loss of vision. During the next 24 hours after the injury, you must stay with someone who can watch you for the warning signs. This person should contact local emergency services (911 in the [place] if you have seizures, you become unconscious, or you are unable to wake up. HOW CAN I PREVENT A HEAD INJURY IN THE FUTURE? The most important factor for preventing major head injuries is avoiding motor vehicle accidents. To minimize the potential for damage to your head, it is crucial to wear seat belts while riding in motor vehicles. Wearing helmets while bike riding and playing collision sports (like football) is also helpful. Also, avoiding dangerous activities around the house will further help reduce your risk of head injury.  WHEN CAN I RETURN TO NORMAL ACTIVITIES AND ATHLETICS? You should be reevaluated by your health care provider before returning to these activities. If you have any of the following symptoms, you should not  return to activities or contact sports until 1 week after the symptoms have stopped:  Persistent headache.  Dizziness or vertigo.  Poor attention and concentration.  Confusion.  Memory problems.  Nausea or vomiting.  Fatigue or tire easily.  Irritability.  Intolerant of bright lights or loud noises.  Anxiety or depression.  Disturbed sleep. MAKE SURE YOU:   Understand these instructions.  Will watch your  condition.  Will get help right away if you are not doing well or get worse. Document Released: 01/10/2005 Document Revised: 10/31/2012 Document Reviewed: 09/17/2012 [facility] [employer] Patient Information 2014 [employer] [place].

## 2013-06-19 NOTE — ED Provider Notes (Signed)
Medical screening examination/treatment/procedure(s) were conducted as a shared visit with non-physician practitioner(s) and myself.  I personally evaluated the patient during the encounter.   EKG Interpretation   Date/Time:  Wednesday Jun 19 2013 13:46:47 EDT Ventricular Rate:  77 PR Interval:    QRS Duration: 121 QT Interval:  384 QTC Calculation: 435 R Axis:   0 Text Interpretation:  Artifact Nonspecific intraventricular conduction  delay Nonspecific repolarization abnormalities Minimal ST elevation,  lateral leads Artifact in lead(s) I II III aVR aVL aVF V1 V2 V3 V4 V5 V6  Confirmed by Manus Gunning  MD, Adriyana Greenbaum [phone]) on 06/19/2013 2:36:00 PM       Ronald Octave, MD 06/19/13 4496

## 2013-06-19 NOTE — ED Notes (Signed)
Pt returned from xray

## 2013-06-19 NOTE — ED Provider Notes (Signed)
CSN: [id number]     Arrival date & time 06/19/13  1233 History   First MD Initiated Contact with Patient 06/19/13 1227     Chief Complaint  Patient presents with  . Fall  . Head Laceration     (Consider location/radiation/quality/duration/timing/severity/associated sxs/prior Treatment) Patient is a 80 y.o. male presenting with fall and scalp laceration.  Fall  Head Laceration    Past Medical History  Diagnosis Date  . Atrial fibrillation   . Dementia   . Hypertension   . Shortness of breath   . Pneumonia 04/2013  . GERD (gastroesophageal reflux disease)    Past Surgical History  Procedure Laterality Date  . Cataract extraction     No family history on file. History  Substance Use Topics  . Smoking status: Former Smoker    Quit date: 06/13/1986  . Smokeless tobacco: Never Used  . Alcohol Use: No    Review of Systems    Allergies  Penicillins  Home Medications   Prior to Admission medications   Medication Sig Start Date End Date Taking? Authorizing Provider  amLODipine (NORVASC) 5 MG tablet Take 5 mg by mouth daily.   Yes Historical Provider, MD  aspirin 81 MG tablet Take 81 mg by mouth daily.   Yes Historical Provider, MD  bisacodyl (DULCOLAX) 10 MG suppository Place 1 suppository (10 mg total) rectally daily as needed for moderate constipation. 05/10/13  Yes Joseph Art, DO  Ferrous Sulfate Dried (SLOW RELEASE IRON) 45 MG TBCR Take 45 mg by mouth daily.    Yes Historical Provider, MD  fish oil-omega-3 fatty acids 1000 MG capsule Take 1 g by mouth daily.    Yes Historical Provider, MD  food thickener (THICK IT) POWD As needed 05/10/13  Yes Jessica U Vann, DO  lactulose (CHRONULAC) 10 GM/15ML solution Take 30 mLs (20 g total) by mouth 2 (two) times daily as needed for mild constipation. 05/10/13  Yes Joseph Art, DO  Multiple Vitamin (MULTIVITAMIN) tablet Take 1 tablet by mouth daily.   Yes Historical Provider, MD  polyethylene glycol (MIRALAX / GLYCOLAX)  packet Take 17 g by mouth daily as needed for mild constipation or moderate constipation. 05/10/13  Yes Joseph Art, DO  sodium phosphate (FLEET) 7-19 GM/118ML ENEM Place 133 mLs (1 enema total) rectally daily as needed for severe constipation. 05/10/13  Yes Jessica U Vann, DO   BP 131/91  Pulse 76  Temp(Src) 95.6 F (35.3 C) (Rectal)  Resp 18  SpO2 97% Physical Exam  ED Course  LACERATION REPAIR Date/Time: 06/19/2013 3:35 PM Performed by: Madelyn Flavors A Authorized by: Madelyn Flavors A Consent: Verbal consent obtained. Risks and benefits: risks, benefits and alternatives were discussed Consent given by: power of attorney Patient understanding: patient states understanding of the procedure being performed Patient consent: the patient's understanding of the procedure matches consent given Procedure consent: procedure consent matches procedure scheduled Relevant documents: relevant documents present and verified Test results: test results available and properly labeled Site marked: the operative site was marked Imaging studies: imaging studies available Patient identity confirmed: arm band Time out: Immediately prior to procedure a "time out" was called to verify the correct patient, procedure, equipment, support staff and site/side marked as required. Body area: head/neck Laceration length: 4 cm Foreign bodies: no foreign bodies Tendon involvement: none Nerve involvement: none Vascular damage: no Anesthesia: local infiltration Local anesthetic: lidocaine 1% without epinephrine Anesthetic total: 4.5 ml Patient sedated: no Preparation: Patient was prepped and draped in  the usual sterile fashion. Irrigation solution: saline Irrigation method: syringe Amount of cleaning: standard Debridement: none Degree of undermining: none Skin closure: 5-0 Prolene Number of sutures: 8 Technique: simple Approximation: close Approximation difficulty: simple Patient tolerance:  Patient tolerated the procedure well with no immediate complications.   (including critical care time) Labs Review Labs Reviewed  CBC WITH DIFFERENTIAL - Abnormal; Notable for the following:    RBC 2.86 (*)    Hemoglobin 9.4 (*)    HCT 27.5 (*)    All other components within normal limits  COMPREHENSIVE METABOLIC PANEL - Abnormal; Notable for the following:    Glucose, Bld 101 (*)    Albumin 3.0 (*)    GFR calc non Af Amer 72 (*)    GFR calc Af Amer 84 (*)    All other components within normal limits  TROPONIN I  PROTIME-INR    Imaging Review Ct Head Wo Contrast  06/19/2013   CLINICAL DATA:  Head trauma secondary to a fall.  EXAM: CT HEAD WITHOUT CONTRAST  CT CERVICAL SPINE WITHOUT CONTRAST  TECHNIQUE: Multidetector CT imaging of the head and cervical spine was performed following the standard protocol without intravenous contrast. Multiplanar CT image reconstructions of the cervical spine were also generated.  COMPARISON:  None.  04/30/2013  FINDINGS: CT HEAD FINDINGS  No mass lesion. No midline shift. No acute hemorrhage or hematoma. No extra-axial fluid collections. No evidence of acute infarction. There is fairly extensive periventricular white matter lucency consistent with chronic small vessel ischemic disease. There is diffuse moderate cerebral cortical atrophy with secondary slight ventricular dilatation. There is also slight cerebellar atrophy.  No acute osseous abnormality.  There is a small scalp hematoma over the left side of the frontal bone.  CT CERVICAL SPINE FINDINGS  There is no acute fracture, acute subluxation, or prevertebral soft tissue swelling. There is severe left facet arthritis at C2-3 and severe right facet arthritis at C3-4 and C4-5. There is moderate bilateral facet arthritis at C7-T1 with a 2.5 mm anterolisthesis at that level.  Benign calcifications in the nuchal ligament at C5-6 and C6-7.  IMPRESSION: 1. No acute intracranial abnormality. Atrophy with chronic  small vessel ischemic disease. 2. Frontal scalp hematoma. 3. No acute abnormality of the cervical spine.   Electronically Signed   By: Geanie CooleyJim  Maxwell M.D.   On: 06/19/2013 14:47   Ct Cervical Spine Wo Contrast  06/19/2013   CLINICAL DATA:  Head trauma secondary to a fall.  EXAM: CT HEAD WITHOUT CONTRAST  CT CERVICAL SPINE WITHOUT CONTRAST  TECHNIQUE: Multidetector CT imaging of the head and cervical spine was performed following the standard protocol without intravenous contrast. Multiplanar CT image reconstructions of the cervical spine were also generated.  COMPARISON:  None.  04/30/2013  FINDINGS: CT HEAD FINDINGS  No mass lesion. No midline shift. No acute hemorrhage or hematoma. No extra-axial fluid collections. No evidence of acute infarction. There is fairly extensive periventricular white matter lucency consistent with chronic small vessel ischemic disease. There is diffuse moderate cerebral cortical atrophy with secondary slight ventricular dilatation. There is also slight cerebellar atrophy.  No acute osseous abnormality.  There is a small scalp hematoma over the left side of the frontal bone.  CT CERVICAL SPINE FINDINGS  There is no acute fracture, acute subluxation, or prevertebral soft tissue swelling. There is severe left facet arthritis at C2-3 and severe right facet arthritis at C3-4 and C4-5. There is moderate bilateral facet arthritis at C7-T1 with a 2.5 mm  anterolisthesis at that level.  Benign calcifications in the nuchal ligament at C5-6 and C6-7.  IMPRESSION: 1. No acute intracranial abnormality. Atrophy with chronic small vessel ischemic disease. 2. Frontal scalp hematoma. 3. No acute abnormality of the cervical spine.   Electronically Signed   By: Geanie Cooley M.D.   On: 06/19/2013 14:47   Dg Pelvis Portable  06/19/2013   CLINICAL DATA:  Fall.  EXAM: PORTABLE PELVIS 1-2 VIEWS  COMPARISON:  None.  FINDINGS: Mild joint space narrowing and spurring in the hip joints bilaterally. SI joints  are symmetric and unremarkable. No definite fracture. Evaluation is somewhat limited by patient positioning.  IMPRESSION: No definite fracture.  Somewhat limited study by positioning.   Electronically Signed   By: Charlett Nose M.D.   On: 06/19/2013 13:43   Dg Chest Portable 1 View  06/19/2013   CLINICAL DATA:  Fall from chair  EXAM: PORTABLE CHEST - 1 VIEW  COMPARISON:  05/05/2013  FINDINGS: The chin partially obscures the lung apices. Normal cardiac silhouette. There is mild opacity in the right lower lobe. The right hemidiaphragm is elevated. No pneumothorax.  IMPRESSION: Mild right lower lobe opacity could represent pneumonia versus atelectasis. .   Electronically Signed   By: Genevive Bi M.D.   On: 06/19/2013 13:44     EKG Interpretation   Date/Time:  Wednesday Jun 19 2013 13:46:47 EDT Ventricular Rate:  77 PR Interval:    QRS Duration: 121 QT Interval:  384 QTC Calculation: 435 R Axis:   0 Text Interpretation:  Artifact Nonspecific intraventricular conduction  delay Nonspecific repolarization abnormalities Minimal ST elevation,  lateral leads Artifact in lead(s) I II III aVR aVL aVF V1 V2 V3 V4 V5 V6  Confirmed by Manus Gunning  MD, STEPHEN [phone]) on 06/19/2013 2:36:00 PM      MDM   Final diagnoses:  None      Clydie Braun, PA-C 06/19/13 1539

## 2013-06-19 NOTE — ED Notes (Signed)
Spoke with son regarding transfer back to [facility]-- [facility] notified, will be approx 1 hour for transfer-- pt awake now, remains nonverbal, will squeeze hands when asked

## 2013-06-19 NOTE — Consult Note (Signed)
Patient Demographics  Ronald Prince, is a 80 y.o. male   MRN: [id number]   DOB - 02-13-1917  Admit Date - 06/19/2013    Outpatient Primary MD for the patient is Elvina Sidle, MD  Consult requested in the Hospital by Glynn Octave, MD, On 06/19/2013    Reason for consult hypothermia, end-of-life care   With History of -  Past Medical History  Diagnosis Date  . Atrial fibrillation   . Dementia   . Hypertension   . Shortness of breath   . Pneumonia 04/2013  . GERD (gastroesophageal reflux disease)       Past Surgical History  Procedure Laterality Date  . Cataract extraction      in for   Chief Complaint  Patient presents with  . Fall  . Head Laceration     HPI  Ronald Prince  is a 80 y.o. male, with history of dementia, atrial fibrillation, hypertension, failure to thrive, living with palliative care under DO NOT RESUSCITATE status in a nursing home with MOST form in place, with goal of care being comfort only.   Patient with above history who lives in a nursing home sustained a fall from wheelchair and had a large laceration on the right side of his forehead, was then brought to the ER where he was  found to be more somnolent than his baseline, possible infiltrate on chest x-ray, hypothermic along with laceration on the forehead.   His laceration was sutured in the ER and I was called to evaluate for hypothermia workup.    Review of Systems    Unobtainable patient is somnolent, getting his for head laceration sutured   Social History History  Substance Use Topics  . Smoking status: Former Smoker    Quit date: 06/13/1986  . Smokeless tobacco: Never Used  . Alcohol Use: No      Family History No history of dementia  Prior to Admission medications   Medication Sig Start Date End Date Taking?  Authorizing Provider  amLODipine (NORVASC) 5 MG tablet Take 5 mg by mouth daily.   Yes Historical Provider, MD  aspirin 81 MG tablet Take 81 mg by mouth daily.   Yes Historical Provider, MD  bisacodyl (DULCOLAX) 10 MG suppository Place 1 suppository (10 mg total) rectally daily as needed for moderate constipation. 05/10/13  Yes Joseph Art, DO  Ferrous Sulfate Dried (SLOW RELEASE IRON) 45 MG TBCR Take 45 mg by mouth daily.    Yes Historical Provider, MD  fish oil-omega-3 fatty acids 1000 MG capsule Take 1 g by mouth daily.    Yes Historical Provider, MD  food thickener (THICK IT) POWD As needed 05/10/13  Yes Jessica U Vann, DO  lactulose (CHRONULAC) 10 GM/15ML solution Take 30 mLs (20 g total) by mouth 2 (two) times daily as needed for mild constipation. 05/10/13  Yes Joseph Art, DO  Multiple Vitamin (MULTIVITAMIN) tablet Take 1 tablet by mouth daily.   Yes Historical Provider, MD  polyethylene glycol (MIRALAX /  GLYCOLAX) packet Take 17 g by mouth daily as needed for mild constipation or moderate constipation. 05/10/13  Yes Joseph Art, DO  sodium phosphate (FLEET) 7-19 GM/118ML ENEM Place 133 mLs (1 enema total) rectally daily as needed for severe constipation. 05/10/13  Yes Joseph Art, DO    Anti-infectives   Start     Dose/Rate Route Frequency Ordered Stop   06/19/13 1600  levofloxacin (LEVAQUIN) IVPB 500 mg     500 mg 100 mL/hr over 60 Minutes Intravenous  Once 06/19/13 1452        Scheduled Meds:  Continuous Infusions: . levofloxacin (LEVAQUIN) IV    . sodium chloride 1,000 mL (06/19/13 1458)   PRN Meds:.  Allergies  Allergen Reactions  . Penicillins Other (See Comments)    Reaction unknown    Physical Exam  Vitals  Blood pressure 131/91, pulse 76, temperature 95.6 F (35.3 C), temperature source Rectal, resp. rate 18, SpO2 97.00%.   General frail elderly male lying in hospital bed wearing a bear hugger with right-sided large for head laceration which being  repaired by the ER PA, he appears comfortable, full exam is being deferred for patient comfort and his hypothermia status.    Data Review  CBC  Recent Labs Lab 06/19/13 1229  WBC 6.9  HGB 9.4*  HCT 27.5*  PLT 347  MCV 96.2  MCH 32.9  MCHC 34.2  RDW 13.5  LYMPHSABS 1.2  MONOABS 0.4  EOSABS 0.1  BASOSABS 0.0   ------------------------------------------------------------------------------------------------------------------  Chemistries   Recent Labs Lab 06/19/13 1229  NA 139  K 4.2  CL 103  CO2 26  GLUCOSE 101*  BUN 21  CREATININE 0.86  CALCIUM 9.0  AST 24  ALT 15  ALKPHOS 77  BILITOT 0.4   ------------------------------------------------------------------------------------------------------------------ CrCl is unknown because both a height and weight (above a minimum accepted value) are required for this calculation. ------------------------------------------------------------------------------------------------------------------ No results found for this basename: TSH, T4TOTAL, FREET3, T3FREE, THYROIDAB,  in the last 72 hours   Coagulation profile  Recent Labs Lab 06/19/13 1229  INR 1.04   ------------------------------------------------------------------------------------------------------------------- No results found for this basename: DDIMER,  in the last 72 hours -------------------------------------------------------------------------------------------------------------------  Cardiac Enzymes  Recent Labs Lab 06/19/13 1229  TROPONINI <0.30   ------------------------------------------------------------------------------------------------------------------ No components found with this basename: POCBNP,    ---------------------------------------------------------------------------------------------------------------  Urinalysis    Component Value Date/Time   COLORURINE YELLOW 04/30/2013 1453   APPEARANCEUR HAZY* 04/30/2013 1453   LABSPEC  1.027 04/30/2013 1453   PHURINE 5.0 04/30/2013 1453   GLUCOSEU NEGATIVE 04/30/2013 1453   HGBUR NEGATIVE 04/30/2013 1453   BILIRUBINUR NEGATIVE 04/30/2013 1453   BILIRUBINUR negative 06/13/2011 1434   KETONESUR 15* 04/30/2013 1453   PROTEINUR 30* 04/30/2013 1453   PROTEINUR 30 06/13/2011 1434   UROBILINOGEN 0.2 04/30/2013 1453   UROBILINOGEN 1.0 06/13/2011 1434   NITRITE NEGATIVE 04/30/2013 1453   NITRITE negative 06/13/2011 1434   LEUKOCYTESUR MODERATE* 04/30/2013 1453     Imaging results:   Ct Head Wo Contrast  06/19/2013   CLINICAL DATA:  Head trauma secondary to a fall.  EXAM: CT HEAD WITHOUT CONTRAST  CT CERVICAL SPINE WITHOUT CONTRAST  TECHNIQUE: Multidetector CT imaging of the head and cervical spine was performed following the standard protocol without intravenous contrast. Multiplanar CT image reconstructions of the cervical spine were also generated.  COMPARISON:  None.  04/30/2013  FINDINGS: CT HEAD FINDINGS  No mass lesion. No midline shift. No acute hemorrhage or hematoma. No extra-axial fluid collections.  No evidence of acute infarction. There is fairly extensive periventricular white matter lucency consistent with chronic small vessel ischemic disease. There is diffuse moderate cerebral cortical atrophy with secondary slight ventricular dilatation. There is also slight cerebellar atrophy.  No acute osseous abnormality.  There is a small scalp hematoma over the left side of the frontal bone.  CT CERVICAL SPINE FINDINGS  There is no acute fracture, acute subluxation, or prevertebral soft tissue swelling. There is severe left facet arthritis at C2-3 and severe right facet arthritis at C3-4 and C4-5. There is moderate bilateral facet arthritis at C7-T1 with a 2.5 mm anterolisthesis at that level.  Benign calcifications in the nuchal ligament at C5-6 and C6-7.  IMPRESSION: 1. No acute intracranial abnormality. Atrophy with chronic small vessel ischemic disease. 2. Frontal scalp hematoma. 3. No acute  abnormality of the cervical spine.   Electronically Signed   By: Geanie CooleyJim  Maxwell M.D.   On: 06/19/2013 14:47   Ct Cervical Spine Wo Contrast  06/19/2013   CLINICAL DATA:  Head trauma secondary to a fall.  EXAM: CT HEAD WITHOUT CONTRAST  CT CERVICAL SPINE WITHOUT CONTRAST  TECHNIQUE: Multidetector CT imaging of the head and cervical spine was performed following the standard protocol without intravenous contrast. Multiplanar CT image reconstructions of the cervical spine were also generated.  COMPARISON:  None.  04/30/2013  FINDINGS: CT HEAD FINDINGS  No mass lesion. No midline shift. No acute hemorrhage or hematoma. No extra-axial fluid collections. No evidence of acute infarction. There is fairly extensive periventricular white matter lucency consistent with chronic small vessel ischemic disease. There is diffuse moderate cerebral cortical atrophy with secondary slight ventricular dilatation. There is also slight cerebellar atrophy.  No acute osseous abnormality.  There is a small scalp hematoma over the left side of the frontal bone.  CT CERVICAL SPINE FINDINGS  There is no acute fracture, acute subluxation, or prevertebral soft tissue swelling. There is severe left facet arthritis at C2-3 and severe right facet arthritis at C3-4 and C4-5. There is moderate bilateral facet arthritis at C7-T1 with a 2.5 mm anterolisthesis at that level.  Benign calcifications in the nuchal ligament at C5-6 and C6-7.  IMPRESSION: 1. No acute intracranial abnormality. Atrophy with chronic small vessel ischemic disease. 2. Frontal scalp hematoma. 3. No acute abnormality of the cervical spine.   Electronically Signed   By: Geanie CooleyJim  Maxwell M.D.   On: 06/19/2013 14:47   Dg Pelvis Portable  06/19/2013   CLINICAL DATA:  Fall.  EXAM: PORTABLE PELVIS 1-2 VIEWS  COMPARISON:  None.  FINDINGS: Mild joint space narrowing and spurring in the hip joints bilaterally. SI joints are symmetric and unremarkable. No definite fracture. Evaluation is  somewhat limited by patient positioning.  IMPRESSION: No definite fracture.  Somewhat limited study by positioning.   Electronically Signed   By: Charlett NoseKevin  Dover M.D.   On: 06/19/2013 13:43   Dg Chest Portable 1 View  06/19/2013   CLINICAL DATA:  Fall from chair  EXAM: PORTABLE CHEST - 1 VIEW  COMPARISON:  05/05/2013  FINDINGS: The chin partially obscures the lung apices. Normal cardiac silhouette. There is mild opacity in the right lower lobe. The right hemidiaphragm is elevated. No pneumothorax.  IMPRESSION: Mild right lower lobe opacity could represent pneumonia versus atelectasis. .   Electronically Signed   By: Genevive BiStewart  Edmunds M.D.   On: 06/19/2013 13:44         Assessment & Plan   Hypothermia, mechanical fall from wheelchair with forehead  laceration, advanced dementia, possible infiltrate versus atelectasis on x-ray. Note patient has a MOST performed in place and the goal of care at this time his comfort only.    I had detailed discussion with patient's son bedside, he agrees that inpatient admission and workup would achieve nothing, at this point the main focus is on keeping him comfortable with the full understanding that he will pass away soon, will request the nursing home staff to focus on keeping patient comfortable, avoid hospital visits and admissions unless comfort becomes an issue, involve hospice. Use oral narcotics, anxiolytics, bowel regimen, oxygen if needed for comfort. If needed and family wishes oral antibiotics can be used however this will not any long-term purpose. Again of care should be directed towards comfort only. He remains DO NOT RESUSCITATE      Family Communication: Plan discussed with son bedisde   Thank you for the consult, we will follow the patient with you in the Hospital.   Leroy Sea M.D on 06/19/2013 at 3:33 PM  Between 7am to 7pm - Pager - [phone]  After 7pm go to [email or web address] - password [email or web address]  And look for the night coverage  person covering me after hours   Thank you for the consult, we will follow the patient with you in the Hospital.   [facility] Office  [phone]   **Disclaimer: This note may have been dictated with voice recognition software. Similar sounding words can inadvertently be transcribed and this note may contain transcription errors which may not have been corrected upon publication of note.**

## 2013-06-19 NOTE — ED Notes (Signed)
[facility] states pt has about 15 people in front of pt before transport is available. Family and pt updated.

## 2013-06-19 NOTE — ED Notes (Signed)
Notified [facility] for transportation back home 

## 2013-06-19 NOTE — ED Notes (Signed)
Pt transported to xray 

## 2013-06-19 NOTE — ED Notes (Addendum)
Report given to Teresita, RN.

## 2013-06-19 NOTE — ED Notes (Signed)
To ED via [facility] from [facility]-- with c/o fell out of w/c -- landed on forehead , large lac to forehead approx 3" midforehead.  Pt at baseline mental status

## 2013-06-19 NOTE — ED Provider Notes (Signed)
CSN: [id number]     Arrival date & time 06/19/13  1233 History   First MD Initiated Contact with Patient 06/19/13 1227     Chief Complaint  Patient presents with  . Fall  . Head Laceration     (Consider location/radiation/quality/duration/timing/severity/associated sxs/prior Treatment) HPI Comments: Patient presents with fall with head laceration. He reportedly fell out of his wheelchair. He has dementia is not able to give a history. Unknown loss of consciousness. He is not a blood thinners. He is at his baseline per family and facility. He denies her questions he does not follow commands. Unknown last tetanus. Level  5 caveat for dementia  The history is provided by the patient.    Past Medical History  Diagnosis Date  . Atrial fibrillation   . Dementia   . Hypertension   . Shortness of breath   . Pneumonia 04/2013  . GERD (gastroesophageal reflux disease)    Past Surgical History  Procedure Laterality Date  . Cataract extraction     No family history on file. History  Substance Use Topics  . Smoking status: Former Smoker    Quit date: 06/13/1986  . Smokeless tobacco: Never Used  . Alcohol Use: No    Review of Systems  Unable to perform ROS: Dementia  Respiratory: Negative for cough and shortness of breath.       Allergies  Penicillins  Home Medications   Prior to Admission medications   Medication Sig Start Date End Date Taking? Authorizing Provider  amLODipine (NORVASC) 5 MG tablet Take 5 mg by mouth daily.    Historical Provider, MD  aspirin 81 MG tablet Take 81 mg by mouth daily.    Historical Provider, MD  bisacodyl (DULCOLAX) 10 MG suppository Place 1 suppository (10 mg total) rectally daily as needed for moderate constipation. 05/10/13   Joseph ArtJessica U Vann, DO  Ferrous Sulfate Dried (SLOW RELEASE IRON) 45 MG TBCR Take 40 mg by mouth daily.    Historical Provider, MD  fish oil-omega-3 fatty acids 1000 MG capsule Take 1 g by mouth daily.     Historical  Provider, MD  food thickener (THICK IT) POWD As needed 05/10/13   Joseph ArtJessica U Vann, DO  lactulose (CHRONULAC) 10 GM/15ML solution Take 30 mLs (20 g total) by mouth 2 (two) times daily as needed for mild constipation. 05/10/13   Joseph ArtJessica U Vann, DO  Multiple Vitamin (MULTIVITAMIN) tablet Take 1 tablet by mouth daily.    Historical Provider, MD  polyethylene glycol (MIRALAX / GLYCOLAX) packet Take 17 g by mouth daily as needed for mild constipation or moderate constipation. 05/10/13   Joseph ArtJessica U Vann, DO  sodium phosphate (FLEET) 7-19 GM/118ML ENEM Place 133 mLs (1 enema total) rectally daily as needed for severe constipation. 05/10/13   Jessica U Vann, DO   BP 92/50  Pulse 85  Temp(Src) 99.2 F (37.3 C) (Oral)  Resp 16  SpO2 95% Physical Exam  Constitutional: He appears well-developed and well-nourished. No distress.  HENT:  Head: Normocephalic and atraumatic.    Mouth/Throat: Oropharynx is clear and moist. No oropharyngeal exudate.  8 cm vertical laceration to forehead  Eyes: Conjunctivae and EOM are normal. Pupils are equal, round, and reactive to light.  Neck: Normal range of motion. Neck supple.  No C spine tenderness  Cardiovascular: Normal rate, regular rhythm and normal heart sounds.   Pulmonary/Chest: Effort normal and breath sounds normal. No respiratory distress.  Abdominal: Soft. There is no tenderness. There is no rebound and  no guarding.  Musculoskeletal: Normal range of motion. He exhibits no edema and no tenderness.  Lower extremity contractures  Neurological: He is alert. No cranial nerve deficit. He exhibits normal muscle tone. Coordination normal.  Contractures to all extremities.  FROM hips without pain  Skin: Skin is warm.    ED Course  Procedures (including critical care time) Labs Review Labs Reviewed  CBC WITH DIFFERENTIAL - Abnormal; Notable for the following:    RBC 2.86 (*)    Hemoglobin 9.4 (*)    HCT 27.5 (*)    All other components within normal limits    COMPREHENSIVE METABOLIC PANEL - Abnormal; Notable for the following:    Glucose, Bld 101 (*)    Albumin 3.0 (*)    GFR calc non Af Amer 72 (*)    GFR calc Af Amer 84 (*)    All other components within normal limits  TROPONIN I  PROTIME-INR    Imaging Review Ct Head Wo Contrast  06/19/2013   CLINICAL DATA:  Head trauma secondary to a fall.  EXAM: CT HEAD WITHOUT CONTRAST  CT CERVICAL SPINE WITHOUT CONTRAST  TECHNIQUE: Multidetector CT imaging of the head and cervical spine was performed following the standard protocol without intravenous contrast. Multiplanar CT image reconstructions of the cervical spine were also generated.  COMPARISON:  None.  04/30/2013  FINDINGS: CT HEAD FINDINGS  No mass lesion. No midline shift. No acute hemorrhage or hematoma. No extra-axial fluid collections. No evidence of acute infarction. There is fairly extensive periventricular white matter lucency consistent with chronic small vessel ischemic disease. There is diffuse moderate cerebral cortical atrophy with secondary slight ventricular dilatation. There is also slight cerebellar atrophy.  No acute osseous abnormality.  There is a small scalp hematoma over the left side of the frontal bone.  CT CERVICAL SPINE FINDINGS  There is no acute fracture, acute subluxation, or prevertebral soft tissue swelling. There is severe left facet arthritis at C2-3 and severe right facet arthritis at C3-4 and C4-5. There is moderate bilateral facet arthritis at C7-T1 with a 2.5 mm anterolisthesis at that level.  Benign calcifications in the nuchal ligament at C5-6 and C6-7.  IMPRESSION: 1. No acute intracranial abnormality. Atrophy with chronic small vessel ischemic disease. 2. Frontal scalp hematoma. 3. No acute abnormality of the cervical spine.   Electronically Signed   By: Geanie Cooley M.D.   On: 06/19/2013 14:47   Ct Cervical Spine Wo Contrast  06/19/2013   CLINICAL DATA:  Head trauma secondary to a fall.  EXAM: CT HEAD WITHOUT  CONTRAST  CT CERVICAL SPINE WITHOUT CONTRAST  TECHNIQUE: Multidetector CT imaging of the head and cervical spine was performed following the standard protocol without intravenous contrast. Multiplanar CT image reconstructions of the cervical spine were also generated.  COMPARISON:  None.  04/30/2013  FINDINGS: CT HEAD FINDINGS  No mass lesion. No midline shift. No acute hemorrhage or hematoma. No extra-axial fluid collections. No evidence of acute infarction. There is fairly extensive periventricular white matter lucency consistent with chronic small vessel ischemic disease. There is diffuse moderate cerebral cortical atrophy with secondary slight ventricular dilatation. There is also slight cerebellar atrophy.  No acute osseous abnormality.  There is a small scalp hematoma over the left side of the frontal bone.  CT CERVICAL SPINE FINDINGS  There is no acute fracture, acute subluxation, or prevertebral soft tissue swelling. There is severe left facet arthritis at C2-3 and severe right facet arthritis at C3-4 and C4-5. There is  moderate bilateral facet arthritis at C7-T1 with a 2.5 mm anterolisthesis at that level.  Benign calcifications in the nuchal ligament at C5-6 and C6-7.  IMPRESSION: 1. No acute intracranial abnormality. Atrophy with chronic small vessel ischemic disease. 2. Frontal scalp hematoma. 3. No acute abnormality of the cervical spine.   Electronically Signed   By: Geanie Cooley M.D.   On: 06/19/2013 14:47   Dg Pelvis Portable  06/19/2013   CLINICAL DATA:  Fall.  EXAM: PORTABLE PELVIS 1-2 VIEWS  COMPARISON:  None.  FINDINGS: Mild joint space narrowing and spurring in the hip joints bilaterally. SI joints are symmetric and unremarkable. No definite fracture. Evaluation is somewhat limited by patient positioning.  IMPRESSION: No definite fracture.  Somewhat limited study by positioning.   Electronically Signed   By: Charlett Nose M.D.   On: 06/19/2013 13:43   Dg Chest Portable 1 View  06/19/2013    CLINICAL DATA:  Fall from chair  EXAM: PORTABLE CHEST - 1 VIEW  COMPARISON:  05/05/2013  FINDINGS: The chin partially obscures the lung apices. Normal cardiac silhouette. There is mild opacity in the right lower lobe. The right hemidiaphragm is elevated. No pneumothorax.  IMPRESSION: Mild right lower lobe opacity could represent pneumonia versus atelectasis. .   Electronically Signed   By: Genevive Bi M.D.   On: 06/19/2013 13:44     EKG Interpretation   Date/Time:  Wednesday Jun 19 2013 13:46:47 EDT Ventricular Rate:  77 PR Interval:    QRS Duration: 121 QT Interval:  384 QTC Calculation: 435 R Axis:   0 Text Interpretation:  Artifact Nonspecific intraventricular conduction  delay Nonspecific repolarization abnormalities Minimal ST elevation,  lateral leads Artifact in lead(s) I II III aVR aVL aVF V1 V2 V3 V4 V5 V6  Confirmed by Manus Gunning  MD, Lendy Dittrich [phone]) on 06/19/2013 2:36:00 PM      MDM   Final diagnoses:  Head injury  HCAP (healthcare-associated pneumonia)   Fall from wheelchair nursing home sustaining laceration to left forehead. Unknown loss of consciousness. Severe dementia at baseline and unable to give a history. At baseline mental status per son. CT head negative for acute intracranial hemorrhage. CT C-spine negative.  Questionable infiltrate in the right lung base. Patient has no fever but is actually hypothermic at 96. Normal white count. Son denies any cough.  Laceration repaired by [facility].  Patient during his previous hospitalization was essentially placed on palliative care. Son states he is DO NOT RESUSCITATE. He does not want hospitalization unless absolutely necessary. He would rather take patient back to nursing home. He understands he is sick and could potentially get sicker.  D/w Dr. Thedore Mins who saw patient and agrees with comfort care. Will treat possible pneumonia with levaquin. Son understands patient is quite ill and at risk of death soon.  Son  would like to take patient back to nursing home rather than be admitted and will focus on keeping him comfortable. He is aware that he can return at any time if he wishes his care to be more aggressive. Suture removal in 1 week.  BP 92/50  Pulse 85  Temp(Src) 99.2 F (37.3 C) (Oral)  Resp 16  SpO2 95%    Glynn Octave, MD 06/19/13 1850

## 2013-07-03 ENCOUNTER — Encounter (HOSPITAL_COMMUNITY): Payer: Self-pay | Admitting: Emergency Medicine

## 2013-07-03 ENCOUNTER — Emergency Department (HOSPITAL_COMMUNITY)
Admission: EM | Admit: 2013-07-03 | Discharge: 2013-07-03 | Disposition: A | Discharge status: Home / Self Care | Payer: Medicare Other | Attending: Emergency Medicine | Admitting: Emergency Medicine

## 2013-07-03 ENCOUNTER — Emergency Department (HOSPITAL_COMMUNITY): Payer: Medicare Other

## 2013-07-03 DIAGNOSIS — Z792 Long term (current) use of antibiotics: Secondary | ICD-10-CM | POA: Insufficient documentation

## 2013-07-03 DIAGNOSIS — Y929 Unspecified place or not applicable: Secondary | ICD-10-CM | POA: Insufficient documentation

## 2013-07-03 DIAGNOSIS — Z7982 Long term (current) use of aspirin: Secondary | ICD-10-CM | POA: Insufficient documentation

## 2013-07-03 DIAGNOSIS — F0390 Unspecified dementia without behavioral disturbance: Secondary | ICD-10-CM | POA: Insufficient documentation

## 2013-07-03 DIAGNOSIS — I4891 Unspecified atrial fibrillation: Secondary | ICD-10-CM | POA: Insufficient documentation

## 2013-07-03 DIAGNOSIS — I10 Essential (primary) hypertension: Secondary | ICD-10-CM | POA: Insufficient documentation

## 2013-07-03 DIAGNOSIS — S0180XA Unspecified open wound of other part of head, initial encounter: Secondary | ICD-10-CM | POA: Insufficient documentation

## 2013-07-03 DIAGNOSIS — R296 Repeated falls: Secondary | ICD-10-CM | POA: Insufficient documentation

## 2013-07-03 DIAGNOSIS — Z880 Allergy status to penicillin: Secondary | ICD-10-CM | POA: Insufficient documentation

## 2013-07-03 DIAGNOSIS — IMO0002 Reserved for concepts with insufficient information to code with codable children: Secondary | ICD-10-CM

## 2013-07-03 DIAGNOSIS — Z8701 Personal history of pneumonia (recurrent): Secondary | ICD-10-CM | POA: Insufficient documentation

## 2013-07-03 DIAGNOSIS — Z87891 Personal history of nicotine dependence: Secondary | ICD-10-CM | POA: Insufficient documentation

## 2013-07-03 DIAGNOSIS — Y92129 Unspecified place in nursing home as the place of occurrence of the external cause: Secondary | ICD-10-CM

## 2013-07-03 DIAGNOSIS — Z79899 Other long term (current) drug therapy: Secondary | ICD-10-CM | POA: Insufficient documentation

## 2013-07-03 DIAGNOSIS — Y939 Activity, unspecified: Secondary | ICD-10-CM | POA: Insufficient documentation

## 2013-07-03 DIAGNOSIS — W19XXXA Unspecified fall, initial encounter: Secondary | ICD-10-CM

## 2013-07-03 DIAGNOSIS — Z8719 Personal history of other diseases of the digestive system: Secondary | ICD-10-CM | POA: Insufficient documentation

## 2013-07-03 DIAGNOSIS — T68XXXA Hypothermia, initial encounter: Secondary | ICD-10-CM | POA: Insufficient documentation

## 2013-07-03 LAB — URINALYSIS, ROUTINE W REFLEX MICROSCOPIC
BILIRUBIN URINE: NEGATIVE
GLUCOSE, UA: NEGATIVE mg/dL
HGB URINE DIPSTICK: NEGATIVE
Ketones, ur: NEGATIVE mg/dL
Leukocytes, UA: NEGATIVE
Nitrite: NEGATIVE
PROTEIN: NEGATIVE mg/dL
Specific Gravity, Urine: 1.022 (ref 1.005–1.030)
UROBILINOGEN UA: 1 mg/dL (ref 0.0–1.0)
pH: 6.5 (ref 5.0–8.0)

## 2013-07-03 LAB — I-STAT CG4 LACTIC ACID, ED: Lactic Acid, Venous: 1.43 mmol/L (ref 0.5–2.2)

## 2013-07-03 LAB — CBC WITH DIFFERENTIAL/PLATELET
Basophils Absolute: 0 10*3/uL (ref 0.0–0.1)
Basophils Relative: 0 % (ref 0–1)
EOS PCT: 1 % (ref 0–5)
Eosinophils Absolute: 0.1 10*3/uL (ref 0.0–0.7)
HCT: 30.8 % — ABNORMAL LOW (ref 39.0–52.0)
Hemoglobin: 10.1 g/dL — ABNORMAL LOW (ref 13.0–17.0)
LYMPHS ABS: 1.1 10*3/uL (ref 0.7–4.0)
LYMPHS PCT: 17 % (ref 12–46)
MCH: 31.9 pg (ref 26.0–34.0)
MCHC: 32.8 g/dL (ref 30.0–36.0)
MCV: 97.2 fL (ref 78.0–100.0)
Monocytes Absolute: 0.6 10*3/uL (ref 0.1–1.0)
Monocytes Relative: 9 % (ref 3–12)
NEUTROS ABS: 4.9 10*3/uL (ref 1.7–7.7)
NEUTROS PCT: 73 % (ref 43–77)
PLATELETS: 305 10*3/uL (ref 150–400)
RBC: 3.17 MIL/uL — AB (ref 4.22–5.81)
RDW: 13.8 % (ref 11.5–15.5)
WBC: 6.8 10*3/uL (ref 4.0–10.5)

## 2013-07-03 LAB — BLOOD GAS, VENOUS
Acid-Base Excess: 3.6 mmol/L — ABNORMAL HIGH (ref 0.0–2.0)
BICARBONATE: 27.7 meq/L — AB (ref 20.0–24.0)
O2 SAT: 81.4 %
PATIENT TEMPERATURE: 98.6
TCO2: 25.6 mmol/L (ref 0–100)
pCO2, Ven: 41.9 mmHg — ABNORMAL LOW (ref 45.0–50.0)
pH, Ven: 7.436 — ABNORMAL HIGH (ref 7.250–7.300)
pO2, Ven: 45.9 mmHg — ABNORMAL HIGH (ref 30.0–45.0)

## 2013-07-03 LAB — COMPREHENSIVE METABOLIC PANEL
ALK PHOS: 84 U/L (ref 39–117)
ALT: 21 U/L (ref 0–53)
AST: 28 U/L (ref 0–37)
Albumin: 3 g/dL — ABNORMAL LOW (ref 3.5–5.2)
BUN: 27 mg/dL — ABNORMAL HIGH (ref 6–23)
CHLORIDE: 106 meq/L (ref 96–112)
CO2: 26 mEq/L (ref 19–32)
Calcium: 9.2 mg/dL (ref 8.4–10.5)
Creatinine, Ser: 0.85 mg/dL (ref 0.50–1.35)
GFR, EST AFRICAN AMERICAN: 84 mL/min — AB (ref >90)
GFR, EST NON AFRICAN AMERICAN: 73 mL/min — AB (ref >90)
GLUCOSE: 119 mg/dL — AB (ref 70–99)
Potassium: 4.2 mEq/L (ref 3.7–5.3)
Sodium: 146 mEq/L (ref 137–147)
Total Bilirubin: 0.4 mg/dL (ref 0.3–1.2)
Total Protein: 6.8 g/dL (ref 6.0–8.3)

## 2013-07-03 LAB — TROPONIN I

## 2013-07-03 LAB — APTT: aPTT: 27 seconds (ref 24–37)

## 2013-07-03 MED ORDER — LIDOCAINE-EPINEPHRINE 2 %-1:100000 IJ SOLN
20.0000 mL | Freq: Once | INTRAMUSCULAR | Status: DC
  Filled 2013-07-03: qty 1

## 2013-07-03 MED ORDER — LEVOFLOXACIN IN D5W 750 MG/150ML IV SOLN: 750.0000 mg | INTRAVENOUS | Status: DC

## 2013-07-03 MED ORDER — VANCOMYCIN HCL IN DEXTROSE 1-5 GM/200ML-% IV SOLN
1000.0000 mg | Freq: Once | INTRAVENOUS | Status: DC
  Filled 2013-07-03: qty 200

## 2013-07-03 MED ORDER — LEVOFLOXACIN IN D5W 750 MG/150ML IV SOLN
750.0000 mg | Freq: Once | INTRAVENOUS | Status: DC
  Administered 2013-07-03: 750 mg via INTRAVENOUS
  Filled 2013-07-03: qty 150

## 2013-07-03 MED ORDER — SODIUM CHLORIDE 0.9 % IV SOLN: 1000.0000 mL | INTRAVENOUS | Status: DC

## 2013-07-03 MED ORDER — DEXTROSE 5 % IV SOLN
1.0000 g | Freq: Three times a day (TID) | INTRAVENOUS | Status: DC
  Filled 2013-07-03: qty 1

## 2013-07-03 MED ORDER — BACITRACIN 500 UNIT/GM EX OINT
1.0000 "application " | TOPICAL_OINTMENT | Freq: Two times a day (BID) | CUTANEOUS | Status: DC
  Filled 2013-07-03: qty 0.9

## 2013-07-03 MED ORDER — AZTREONAM 2 G IJ SOLR
2.0000 g | Freq: Once | INTRAMUSCULAR | Status: DC
  Filled 2013-07-03: qty 2

## 2013-07-03 MED ORDER — SODIUM CHLORIDE 0.9 % IV SOLN: 500.0000 mg | Freq: Two times a day (BID) | INTRAVENOUS | Status: DC

## 2013-07-03 MED ORDER — SODIUM CHLORIDE 0.9 % IV BOLUS (SEPSIS)
30.0000 mL/kg | Freq: Once | INTRAVENOUS | Status: AC
Start: 2013-07-03 — End: 2013-07-03
  Administered 2013-07-03: 1974 mL via INTRAVENOUS

## 2013-07-03 NOTE — Discharge Instructions (Signed)
If you see signs of infection (warmth, redness, tenderness, pus, sharp increase in pain, fever, red streaking) immediately return to the emergency department.  Please follow with your primary care doctor in the next 2 days for a check-up. They must obtain records for further management.   Do not hesitate to return to the Emergency Department for any new, worsening or concerning symptoms.    Laceration Care, Adult A laceration is a cut that goes through all layers of the skin. The cut goes into the tissue beneath the skin. HOME CARE For stitches (sutures) or staples:  Keep the cut clean and dry.  If you have a bandage (dressing), change it at least once a day. Change the bandage if it gets wet or dirty, or as told by your doctor.  Wash the cut with soap and water 2 times a day. Rinse the cut with water. Pat it dry with a clean towel.  Put a thin layer of medicated cream on the cut as told by your doctor.  You may shower after the first 24 hours. Do not soak the cut in water until the stitches are removed.  Only take medicines as told by your doctor.  Have your stitches or staples removed as told by your doctor. For skin adhesive strips:  Keep the cut clean and dry.  Do not get the strips wet. You may take a bath, but be careful to keep the cut dry.  If the cut gets wet, pat it dry with a clean towel.  The strips will fall off on their own. Do not remove the strips that are still stuck to the cut. For wound glue:  You may shower or take baths. Do not soak or scrub the cut. Do not swim. Avoid heavy sweating until the glue falls off on its own. After a shower or bath, pat the cut dry with a clean towel.  Do not put medicine on your cut until the glue falls off.  If you have a bandage, do not put tape over the glue.  Avoid lots of sunlight or tanning lamps until the glue falls off. Put sunscreen on the cut for the first year to reduce your scar.  The glue will fall off on its  own. Do not pick at the glue. You may need a tetanus shot if:  You cannot remember when you had your last tetanus shot.  You have never had a tetanus shot. If you need a tetanus shot and you choose not to have one, you may get tetanus. Sickness from tetanus can be serious. GET HELP RIGHT AWAY IF:   Your pain does not get better with medicine.  Your arm, hand, leg, or foot loses feeling (numbness) or changes color.  Your cut is bleeding.  Your joint feels weak, or you cannot use your joint.  You have painful lumps on your body.  Your cut is red, puffy (swollen), or painful.  You have a red line on the skin near the cut.  You have yellowish-white fluid (pus) coming from the cut.  You have a fever.  You have a bad smell coming from the cut or bandage.  Your cut breaks open before or after stitches are removed.  You notice something coming out of the cut, such as wood or glass.  You cannot move a finger or toe. MAKE SURE YOU:   Understand these instructions.  Will watch your condition.  Will get help right away if you are not doing  well or get worse. Document Released: 06/29/2007 Document Revised: 04/04/2011 Document Reviewed: 07/06/2010 [facility] Patient Information 2014 [place], [place].

## 2013-07-03 NOTE — ED Notes (Signed)
Pt has small skin tears on penis and a larger skin tear on his bottom. Pt cleaned and sacral butterfly pad placed on pt. Will notify [facility].

## 2013-07-03 NOTE — ED Provider Notes (Signed)
CSN: [id number]     Arrival date & time 07/03/13  1738 History   First MD Initiated Contact with Patient 07/03/13 1754     Chief Complaint  Patient presents with  . Fall  . Head Laceration     (Consider location/radiation/quality/duration/timing/severity/associated sxs/prior Treatment) HPI  Ronald Prince is a 80 y.o. male with past medical history significant for advanced dementia, M.D. and arm, resident of [facility] nursing home. He is sent there for evaluation of fall with head trauma and laceration. Give details as to the fall. Patient is accompanied by his son who states that he is mentating at his baseline. The patient's is nonverbal, level V caveat secondary to dementia.   Past Medical History  Diagnosis Date  . Atrial fibrillation   . Dementia   . Hypertension   . Shortness of breath   . Pneumonia 04/2013  . GERD (gastroesophageal reflux disease)    Past Surgical History  Procedure Laterality Date  . Cataract extraction     No family history on file. History  Substance Use Topics  . Smoking status: Former Smoker    Quit date: 06/13/1986  . Smokeless tobacco: Never Used  . Alcohol Use: No    Review of Systems  10 systems reviewed and found to be negative, except as noted in the HPI.   Allergies  Penicillins  Home Medications   Prior to Admission medications   Medication Sig Start Date End Date Taking? Authorizing Provider  amLODipine (NORVASC) 5 MG tablet Take 5 mg by mouth daily.   Yes Historical Provider, MD  aspirin 81 MG tablet Take 81 mg by mouth daily.   Yes Historical Provider, MD  bisacodyl (DULCOLAX) 10 MG suppository Place 1 suppository (10 mg total) rectally daily as needed for moderate constipation. 05/10/13  Yes Joseph Art, DO  Ferrous Sulfate Dried (SLOW RELEASE IRON) 45 MG TBCR Take 45 mg by mouth daily.    Yes Historical Provider, MD  fish oil-omega-3 fatty acids 1000 MG capsule Take 1 g by mouth daily.    Yes Historical Provider, MD    lactulose (CHRONULAC) 10 GM/15ML solution Take 30 mLs (20 g total) by mouth 2 (two) times daily as needed for mild constipation. 05/10/13  Yes Joseph Art, DO  Multiple Vitamin (MULTIVITAMIN WITH MINERALS) TABS tablet Take 1 tablet by mouth daily.   Yes Historical Provider, MD  polyethylene glycol (MIRALAX / GLYCOLAX) packet Take 17 g by mouth daily as needed for mild constipation or moderate constipation. 05/10/13  Yes Joseph Art, DO  sodium phosphate (FLEET) 7-19 GM/118ML ENEM Place 133 mLs (1 enema total) rectally daily as needed for severe constipation. 05/10/13  Yes Joseph Art, DO  food thickener (THICK IT) POWD As needed 05/10/13   Joseph Art, DO  levofloxacin (LEVAQUIN) 750 MG tablet Take 1 tablet (750 mg total) by mouth daily. 06/19/13   Glynn Octave, MD   BP 125/61  Pulse 91  Temp(Src) 97.8 F (36.6 C) (Oral)  Resp 18  Ht 5\' 10"  (1.778 m)  Wt 145 lb (65.772 kg)  BMI 20.81 kg/m2  SpO2 97% Physical Exam  Nursing note and vitals reviewed. Constitutional: He is oriented to person, place, and time. He appears well-developed and well-nourished. No distress.  HENT:  Head: Normocephalic.    Mouth/Throat: Oropharynx is clear and moist.  Irregular, jagged, 6 cm full-thickness laceration to left for head, no galeal involvement. Bleeding is controlled. No contamination  Just inferior to that there is  a well-healed laceration with sutures in place, clean dry and intact.  Eyes: Conjunctivae and EOM are normal. Pupils are equal, round, and reactive to light.  Neck: Normal range of motion.  Cardiovascular: Normal rate, regular rhythm and intact distal pulses.   Pulmonary/Chest: Effort normal and breath sounds normal. No stridor. No respiratory distress. He has no wheezes. He has no rales. He exhibits no tenderness.  Abdominal: Soft. Bowel sounds are normal. He exhibits no distension and no mass. There is no tenderness. There is no rebound and no guarding.  Musculoskeletal:  Normal range of motion. He exhibits no edema.  All extremities contracted  Neurological: He is alert and oriented to person, place, and time.  Psychiatric: He has a normal mood and affect.    ED Course  SUTURE REMOVAL Date/Time: 07/03/2013 9:20 PM Performed by: Wynetta Emery Authorized by: Wynetta Emery Consent given by: guardian Patient identity confirmed: arm band Body area: head/neck Location details: forehead Wound Appearance: clean Sutures Removed: 8 Post-removal: antibiotic ointment applied Facility: sutures placed in this facility Patient tolerance: Patient tolerated the procedure well with no immediate complications.   (including critical care time) LACERATION REPAIR Performed by: Wynetta Emery Consent: Verbal consent obtained. Risks and benefits: risks, benefits and alternatives were discussed Consent given by: patient Patient identity confirmed: Wrist band   Wound explored to depth in good light on a bloodless field, with no foreign bodies seen or palpated.  Prepped and draped in normal sterile fashion    Tetanus: UTD  Laceration Location: forehead  Laceration Length: 6 cm  Anesthesia: Local  Local anesthetic: 1% with epinephrine  Anesthetic total: 6 ml  Irrigation method: Low Pressure  Amount of cleaning: 1L sterile NS  Skin closure: 5-0 Vicryl Rapide  Number of sutures: 12  Technique: Combination of corner sutures and running locking  Patient tolerance: Patient tolerated the procedure well with no immediate complications.   Antibx ointment applied. Instructions for care discussed verbally and patient provided with additional written instructions for homecare and f/u.   Labs Review Labs Reviewed  CBC WITH DIFFERENTIAL - Abnormal; Notable for the following:    RBC 3.17 (*)    Hemoglobin 10.1 (*)    HCT 30.8 (*)    All other components within normal limits  COMPREHENSIVE METABOLIC PANEL - Abnormal; Notable for the following:    Glucose,  Bld 119 (*)    BUN 27 (*)    Albumin 3.0 (*)    GFR calc non Af Amer 73 (*)    GFR calc Af Amer 84 (*)    All other components within normal limits  BLOOD GAS, VENOUS - Abnormal; Notable for the following:    pH, Ven 7.436 (*)    pCO2, Ven 41.9 (*)    pO2, Ven 45.9 (*)    Bicarbonate 27.7 (*)    Acid-Base Excess 3.6 (*)    All other components within normal limits  CULTURE, BLOOD (ROUTINE X 2)  CULTURE, BLOOD (ROUTINE X 2)  URINE CULTURE  URINALYSIS, ROUTINE W REFLEX MICROSCOPIC  APTT  TROPONIN I  I-STAT CG4 LACTIC ACID, ED    Imaging Review Dg Chest 1 View  07/03/2013   CLINICAL DATA:  Fall, head laceration, hypertension.  EXAM: CHEST - 1 VIEW  COMPARISON:  06/19/2013  FINDINGS: Colonic interposition over the liver. Low lung volumes. Cardiomegaly. No infiltrates or failure. No effusion or pneumothorax. No definite rib fracture. Bibasilar subsegmental atelectasis. Similar appearance to priors. Degenerative change both shoulders. Aortic arch atherosclerosis.  IMPRESSION: Stable chest.  No definite acute abnormality.   Electronically Signed   By: Davonna Belling M.D.   On: 07/03/2013 18:51   Ct Head Wo Contrast  07/03/2013   CLINICAL DATA:  Severe headache. Multiple forehead abrasions secondary to a fall.  EXAM: CT HEAD WITHOUT CONTRAST  CT CERVICAL SPINE WITHOUT CONTRAST  TECHNIQUE: Multidetector CT imaging of the head and cervical spine was performed following the standard protocol without intravenous contrast. Multiplanar CT image reconstructions of the cervical spine were also generated.  COMPARISON:  CT scans dated 06/19/2013  FINDINGS: CT HEAD FINDINGS  No mass lesion. No midline shift. No acute hemorrhage or hematoma. No extra-axial fluid collections. No evidence of acute infarction. There is diffuse moderate cerebral cortical and cerebellar atrophy with secondary ventricular dilatation. There is chronic periventricular white matter lucency consistent with small vessel ischemic disease.  There has been no change in the appearance of the brain since the prior study.  No acute osseous abnormality. Slight. Swelling over the frontal bone.  CT CERVICAL SPINE FINDINGS  There is no fracture or acute subluxation. No prevertebral soft tissue swelling. There is multilevel degenerative disc and joint disease, unchanged since the prior exam.  IMPRESSION: 1. No acute intracranial abnormality. Diffuse atrophy. Frontal scalp contusion. 2. No acute abnormality of the cervical spine. Multilevel degenerative disc and joint disease, stable.   Electronically Signed   By: Geanie Cooley M.D.   On: 07/03/2013 19:09   Ct Cervical Spine Wo Contrast  07/03/2013   CLINICAL DATA:  Severe headache. Multiple forehead abrasions secondary to a fall.  EXAM: CT HEAD WITHOUT CONTRAST  CT CERVICAL SPINE WITHOUT CONTRAST  TECHNIQUE: Multidetector CT imaging of the head and cervical spine was performed following the standard protocol without intravenous contrast. Multiplanar CT image reconstructions of the cervical spine were also generated.  COMPARISON:  CT scans dated 06/19/2013  FINDINGS: CT HEAD FINDINGS  No mass lesion. No midline shift. No acute hemorrhage or hematoma. No extra-axial fluid collections. No evidence of acute infarction. There is diffuse moderate cerebral cortical and cerebellar atrophy with secondary ventricular dilatation. There is chronic periventricular white matter lucency consistent with small vessel ischemic disease. There has been no change in the appearance of the brain since the prior study.  No acute osseous abnormality. Slight. Swelling over the frontal bone.  CT CERVICAL SPINE FINDINGS  There is no fracture or acute subluxation. No prevertebral soft tissue swelling. There is multilevel degenerative disc and joint disease, unchanged since the prior exam.  IMPRESSION: 1. No acute intracranial abnormality. Diffuse atrophy. Frontal scalp contusion. 2. No acute abnormality of the cervical spine. Multilevel  degenerative disc and joint disease, stable.   Electronically Signed   By: Geanie Cooley M.D.   On: 07/03/2013 19:09     EKG Interpretation None      MDM   Final diagnoses:  Laceration  Hypothermia  Dementia  Fall at nursing home   Filed Vitals:   07/03/13 1829 07/03/13 1830 07/03/13 1900 07/03/13 1932  BP:  119/85 131/85 125/61  Pulse:  81 88 91  Temp:    97.8 F (36.6 C)  TempSrc:    Oral  Resp:  23  18  Height:  5\' 10"  (1.778 m)    Weight: 145 lb (65.772 kg)     SpO2:  97% 97% 97%    Medications  sodium chloride 0.9 % bolus 1,974 mL (1,974 mLs Intravenous New Bag/Given 07/03/13 1840)    Followed by  0.9 %  sodium chloride infusion (not administered)  lidocaine-EPINEPHrine (XYLOCAINE W/EPI) 2 %-1:100000 (with pres) injection 20 mL (not administered)  bacitracin ointment 1 application (not administered)    Ronald Prince is a 80 y.o. male with advanced dementia, has DO NOT RESUSCITATE in place presenting with fall in nursing home, as per patient's son he tends to try to get up out of the wheelchair causing falls. Patient is found to be hypothermic initially, bair hugger and sepsis protocol was initiated. On chart review, patient had a similar incident of fall with hypothermia on May 27.At that time the prior infiltrate found on chest x-ray, patient was started on Levaquin and discharged home. Urinalysis also with no signs of infection. Antibiotics were DC'd.X-ray today shows no abnormalities.  Patient is mentating at his baseline. Head CT and cervical spine CT are negative.  Sutures from prior laceration were removed and absorbable sutures placed to stellate laceration on the forehead. Patient's son is comfortable with discharge.  Discussed case with attending MD who agrees with plan and stability to d/c to home.   Evaluation does not show pathology that would require ongoing emergent intervention or inpatient treatment. Pt is hemodynamically stable and mentating appropriately.  Discussed findings and plan with patient/guardian, who agrees with care plan. All questions answered. Return precautions discussed and outpatient follow up given.        Wynetta Emeryicole Maryam Feely, PA-C 07/03/13 2129

## 2013-07-03 NOTE — ED Notes (Signed)
Pt presents from [facility] after fall. Pt has head laceration to center of forehead. Per family, pt keeps trying to get out of wheelchair. Pt has hx of dementia. Pt fell two weeks ago and beside new head lac pt. Has a stitched up laceration that stitches should've been removed from last week. Skin has begun to grow over sutures. Pt does not talk per baseline. Family at bedside.

## 2013-07-03 NOTE — Progress Notes (Signed)
ANTIBIOTIC CONSULT NOTE - INITIAL  Pharmacy Consult for Vancomycin, Aztreonam, Levaquin Indication: rule out sepsis  Allergies  Allergen Reactions  . Penicillins Other (See Comments)    Reaction unknown    Patient Measurements: Height: 5\' 10"  (177.8 cm) Weight: 145 lb (65.772 kg) IBW/kg (Calculated) : 73   Vital Signs: Temp: 96 F (35.6 C) (06/10 1756) Temp src: Rectal (06/10 1756) BP: 136/76 mmHg (06/10 1756) Pulse Rate: 90 (06/10 1756) Intake/Output from previous day:   Intake/Output from this shift:    Labs: No results found for this basename: WBC, HGB, PLT, LABCREA, CREATININE,  in the last 72 hours Estimated Creatinine Clearance: 49.9 ml/min (by C-G formula based on Cr of 0.86). No results found for this basename: VANCOTROUGH, VANCOPEAK, VANCORANDOM, GENTTROUGH, GENTPEAK, GENTRANDOM, TOBRATROUGH, TOBRAPEAK, TOBRARND, AMIKACINPEAK, AMIKACINTROU, AMIKACIN,  in the last 72 hours   Microbiology: No results found for this or any previous visit (from the past 720 hour(s)).  Medical History: Past Medical History  Diagnosis Date  . Atrial fibrillation   . Dementia   . Hypertension   . Shortness of breath   . Pneumonia 04/2013  . GERD (gastroesophageal reflux disease)     Medications:   (Not in a hospital admission) Anti-infectives   Start     Dose/Rate Route Frequency Ordered Stop   07/03/13 1815  levofloxacin (LEVAQUIN) IVPB 750 mg     750 mg 100 mL/hr over 90 Minutes Intravenous  Once 07/03/13 1804     07/03/13 1815  aztreonam (AZACTAM) 2 g in dextrose 5 % 50 mL IVPB     2 g 100 mL/hr over 30 Minutes Intravenous  Once 07/03/13 1804     07/03/13 1815  vancomycin (VANCOCIN) IVPB 1000 mg/200 mL premix     1,000 mg 200 mL/hr over 60 Minutes Intravenous  Once 07/03/13 1804       Assessment: 80yo M, NH resident with unwitnessed fall, found face down with laceration to the forehead. He had a similar presentation two weeks ago. Pharmacy is asked to dose  vancomycin, aztreonam, and levofloxacin for possible sepsis. First doses ordered in the ER.  SCr wnl, CrCl 51CG, 55N     Goal of Therapy:  Vancomycin trough level 15-20 mcg/ml Appropriate antibiotic dosing for renal function; eradication of infection  Plan:   Vancomycin 500mg  IV q12h, after 1g loading dose.  Aztreonam 1g IV q8h, after 2g loading dose.  Levaquin 750mg  IV q24h.  Measure Vanc trough at steady state.  Follow up renal fxn and culture results.  Charolotte Eke, PharmD, pager [phone]. 07/03/2013,7:38 PM.

## 2013-07-03 NOTE — ED Notes (Signed)
Per EMS: Pt from [facility].  Had unwitnessed fall in the dining room.  Found face down on floor with head under table.  Was assisted to a seated position prior to EMS arrival.  5cm X 2cm lac to forehead.  Old laceration 5 cm noted to just below new lac with old stitches that skin has grown over.

## 2013-07-03 NOTE — ED Notes (Signed)
Report to [place] at [facility].  Ronald Prince made aware of need for wound/suture care and skin tears noted to buttocks and need for frequent turning and positioning.  Discussed fall precautions with staff about pt's frequent falls.  [facility]AR notified of need for transport back to facility

## 2013-07-03 NOTE — ED Notes (Signed)
Bed: QR97 Expected date:  Expected time:  Means of arrival:  Comments: Housekeeping mopping blood off floor.

## 2013-07-03 NOTE — ED Notes (Signed)
Patient transported to X-ray 

## 2013-07-04 ENCOUNTER — Emergency Department (HOSPITAL_COMMUNITY)
Admission: EM | Admit: 2013-07-04 | Discharge: 2013-07-04 | Disposition: A | Discharge status: Home / Self Care | Payer: Medicare Other | Attending: Emergency Medicine | Admitting: Emergency Medicine

## 2013-07-04 ENCOUNTER — Encounter (HOSPITAL_COMMUNITY): Payer: Self-pay | Admitting: Emergency Medicine

## 2013-07-04 ENCOUNTER — Emergency Department (HOSPITAL_COMMUNITY): Payer: Medicare Other

## 2013-07-04 DIAGNOSIS — Y9210 Unspecified residential institution as the place of occurrence of the external cause: Secondary | ICD-10-CM | POA: Insufficient documentation

## 2013-07-04 DIAGNOSIS — Z87891 Personal history of nicotine dependence: Secondary | ICD-10-CM | POA: Insufficient documentation

## 2013-07-04 DIAGNOSIS — Y92129 Unspecified place in nursing home as the place of occurrence of the external cause: Secondary | ICD-10-CM

## 2013-07-04 DIAGNOSIS — Z8719 Personal history of other diseases of the digestive system: Secondary | ICD-10-CM | POA: Insufficient documentation

## 2013-07-04 DIAGNOSIS — Y939 Activity, unspecified: Secondary | ICD-10-CM | POA: Insufficient documentation

## 2013-07-04 DIAGNOSIS — Z8701 Personal history of pneumonia (recurrent): Secondary | ICD-10-CM | POA: Insufficient documentation

## 2013-07-04 DIAGNOSIS — S0083XA Contusion of other part of head, initial encounter: Secondary | ICD-10-CM | POA: Insufficient documentation

## 2013-07-04 DIAGNOSIS — F0390 Unspecified dementia without behavioral disturbance: Secondary | ICD-10-CM | POA: Insufficient documentation

## 2013-07-04 DIAGNOSIS — S0190XA Unspecified open wound of unspecified part of head, initial encounter: Secondary | ICD-10-CM | POA: Insufficient documentation

## 2013-07-04 DIAGNOSIS — Z792 Long term (current) use of antibiotics: Secondary | ICD-10-CM | POA: Insufficient documentation

## 2013-07-04 DIAGNOSIS — Z880 Allergy status to penicillin: Secondary | ICD-10-CM | POA: Insufficient documentation

## 2013-07-04 DIAGNOSIS — S0003XA Contusion of scalp, initial encounter: Secondary | ICD-10-CM | POA: Insufficient documentation

## 2013-07-04 DIAGNOSIS — Z7982 Long term (current) use of aspirin: Secondary | ICD-10-CM | POA: Insufficient documentation

## 2013-07-04 DIAGNOSIS — I10 Essential (primary) hypertension: Secondary | ICD-10-CM | POA: Insufficient documentation

## 2013-07-04 DIAGNOSIS — S0990XA Unspecified injury of head, initial encounter: Secondary | ICD-10-CM

## 2013-07-04 DIAGNOSIS — R296 Repeated falls: Secondary | ICD-10-CM | POA: Insufficient documentation

## 2013-07-04 DIAGNOSIS — Z79899 Other long term (current) drug therapy: Secondary | ICD-10-CM | POA: Insufficient documentation

## 2013-07-04 DIAGNOSIS — S1093XA Contusion of unspecified part of neck, initial encounter: Secondary | ICD-10-CM

## 2013-07-04 DIAGNOSIS — W19XXXA Unspecified fall, initial encounter: Secondary | ICD-10-CM

## 2013-07-04 LAB — URINE CULTURE
COLONY COUNT: NO GROWTH
Culture: NO GROWTH

## 2013-07-04 NOTE — Discharge Instructions (Signed)
Fall Prevention and Home Safety Falls cause injuries and can affect all age groups. It is possible to use preventive measures to significantly decrease the likelihood of falls. There are many simple measures which can make your home safer and prevent falls. OUTDOORS  Repair cracks and edges of walkways and driveways.  Remove high doorway thresholds.  Trim shrubbery on the main path into your home.  Have good outside lighting.  Clear walkways of tools, rocks, debris, and clutter.  Check that handrails are not broken and are securely fastened. Both sides of steps should have handrails.  Have leaves, snow, and ice cleared regularly.  Use sand or salt on walkways during winter months.  In the garage, clean up grease or oil spills. BATHROOM  Install night lights.  Install grab bars by the toilet and in the tub and shower.  Use non-skid mats or decals in the tub or shower.  Place a plastic non-slip stool in the shower to sit on, if needed.  Keep floors dry and clean up all water on the floor immediately.  Remove soap buildup in the tub or shower on a regular basis.  Secure bath mats with non-slip, double-sided rug tape.  Remove throw rugs and tripping hazards from the floors. BEDROOMS  Install night lights.  Make sure a bedside light is easy to reach.  Do not use oversized bedding.  Keep a telephone by your bedside.  Have a firm chair with side arms to use for getting dressed.  Remove throw rugs and tripping hazards from the floor. KITCHEN  Keep handles on pots and pans turned toward the center of the stove. Use back burners when possible.  Clean up spills quickly and allow time for drying.  Avoid walking on wet floors.  Avoid hot utensils and knives.  Position shelves so they are not too high or low.  Place commonly used objects within easy reach.  If necessary, use a sturdy step stool with a grab bar when reaching.  Keep electrical cables out of the  way.  Do not use floor polish or wax that makes floors slippery. If you must use wax, use non-skid floor wax.  Remove throw rugs and tripping hazards from the floor. STAIRWAYS  Never leave objects on stairs.  Place handrails on both sides of stairways and use them. Fix any loose handrails. Make sure handrails on both sides of the stairways are as long as the stairs.  Check carpeting to make sure it is firmly attached along stairs. Make repairs to worn or loose carpet promptly.  Avoid placing throw rugs at the top or bottom of stairways, or properly secure the rug with carpet tape to prevent slippage. Get rid of throw rugs, if possible.  Have an electrician put in a light switch at the top and bottom of the stairs. OTHER FALL PREVENTION TIPS  Wear low-heel or rubber-soled shoes that are supportive and fit well. Wear closed toe shoes.  When using a stepladder, make sure it is fully opened and both spreaders are firmly locked. Do not climb a closed stepladder.  Add color or contrast paint or tape to grab bars and handrails in your home. Place contrasting color strips on first and last steps.  Learn and use mobility aids as needed. Install an electrical emergency response system.  Turn on lights to avoid dark areas. Replace light bulbs that burn out immediately. Get light switches that glow.  Arrange furniture to create clear pathways. Keep furniture in the same place.  Firmly attach carpet with non-skid or double-sided tape. °· Eliminate uneven floor surfaces. °· Select a carpet pattern that does not visually hide the edge of steps. °· Be aware of all pets. °OTHER HOME SAFETY TIPS °· Set the water temperature for 120° F (48.8° C). °· Keep emergency numbers on or near the telephone. °· Keep smoke detectors on every level of the home and near sleeping areas. °Document Released: 12/31/2001 Document Revised: 07/12/2011 Document Reviewed: 04/01/2011 °ExitCare® Patient Information ©2014  [employer]. ° °Head Injury, Adult °You have received a head injury. It does not appear serious at this time. Headaches and vomiting are common following head injury. It should be easy to awaken from sleeping. Sometimes it is necessary for you to stay in the emergency department for a while for observation. Sometimes admission to the hospital may be needed. After injuries such as yours, most problems occur within the first 24 hours, but side effects may occur up to 7 10 days after the injury. It is important for you to carefully monitor your condition and contact your health care provider or seek immediate medical care if there is a change in your condition. °WHAT ARE THE TYPES OF HEAD INJURIES? °Head injuries can be as minor as a bump. Some head injuries can be more severe. More severe head injuries include: °· A jarring injury to the brain (concussion). °· A bruise of the brain (contusion). This mean there is bleeding in the brain that can cause swelling. °· A cracked skull (skull fracture). °· Bleeding in the brain that collects, clots, and forms a bump (hematoma). °WHAT CAUSES A HEAD INJURY? °A serious head injury is most likely to happen to someone who is in a car wreck and is not wearing a seat belt. Other causes of major head injuries include bicycle or motorcycle accidents, sports injuries, and falls. °HOW ARE HEAD INJURIES DIAGNOSED? °A complete history of the event leading to the injury and your current symptoms will be helpful in diagnosing head injuries. Many times, pictures of the brain, such as CT or MRI are needed to see the extent of the injury. Often, an overnight hospital stay is necessary for observation.  °WHEN SHOULD I SEEK IMMEDIATE MEDICAL CARE?  °You should get help right away if: °· You have confusion or drowsiness. °· You feel sick to your stomach (nauseous) or have continued, forceful vomiting. °· You have dizziness or unsteadiness that is getting worse. °· You have severe, continued  headaches not relieved by medicine. Only take over-the-counter or prescription medicines for pain, fever, or discomfort as directed by your health care provider. °· You do not have normal function of the arms or legs or are unable to walk. °· You notice changes in the black spots in the center of the colored part of your eye (pupil). °· You have a clear or bloody fluid coming from your nose or ears. °· You have a loss of vision. °During the next 24 hours after the injury, you must stay with someone who can watch you for the warning signs. This person should contact local emergency services (911 in the [place] if you have seizures, you become unconscious, or you are unable to wake up. °HOW CAN I PREVENT A HEAD INJURY IN THE FUTURE? °The most important factor for preventing major head injuries is avoiding motor vehicle accidents.  To minimize the potential for damage to your head, it is crucial to wear seat belts while riding in motor vehicles. Wearing helmets while bike riding   and playing collision sports (like football) is also helpful. Also, avoiding dangerous activities around the house will further help reduce your risk of head injury.  WHEN CAN I RETURN TO NORMAL ACTIVITIES AND ATHLETICS? You should be reevaluated by your health care provider before returning to these activities. If you have any of the following symptoms, you should not return to activities or contact sports until 1 week after the symptoms have stopped:  Persistent headache.  Dizziness or vertigo.  Poor attention and concentration.  Confusion.  Memory problems.  Nausea or vomiting.  Fatigue or tire easily.  Irritability.  Intolerant of bright lights or loud noises.  Anxiety or depression.  Disturbed sleep. MAKE SURE YOU:   Understand these instructions.  Will watch your condition.  Will get help right away if you are not doing well or get worse. Document Released: 01/10/2005 Document Revised: 10/31/2012 Document  Reviewed: 09/17/2012 [facility] Patient Information 2014 [place], [place].  Hypertension As your heart beats, it forces blood through your arteries. This force is your blood pressure. If the pressure is too high, it is called hypertension (HTN) or high blood pressure. HTN is dangerous because you may have it and not know it. High blood pressure may mean that your heart has to work harder to pump blood. Your arteries may be narrow or stiff. The extra work puts you at risk for heart disease, stroke, and other problems.  Blood pressure consists of two numbers, a higher number over a lower, 110/72, for example. It is stated as "110 over 72." The ideal is below 120 for the top number (systolic) and under 80 for the bottom (diastolic). Write down your blood pressure today. You should pay close attention to your blood pressure if you have certain conditions such as: Heart failure. Prior heart attack. Diabetes Chronic kidney disease. Prior stroke. Multiple risk factors for heart disease. To see if you have HTN, your blood pressure should be measured while you are seated with your arm held at the level of the heart. It should be measured at least twice. A one-time elevated blood pressure reading (especially in the Emergency Department) does not mean that you need treatment. There may be conditions in which the blood pressure is different between your right and left arms. It is important to see your caregiver soon for a recheck. Most people have essential hypertension which means that there is not a specific cause. This type of high blood pressure may be lowered by changing lifestyle factors such as: Stress. Smoking. Lack of exercise. Excessive weight. Drug/tobacco/alcohol use. Eating less salt. Most people do not have symptoms from high blood pressure until it has caused damage to the body. Effective treatment can often prevent, delay or reduce that damage. TREATMENT  When a cause has been identified,  treatment for high blood pressure is directed at the cause. There are a large number of medications to treat HTN. These fall into several categories, and your caregiver will help you select the medicines that are best for you. Medications may have side effects. You should review side effects with your caregiver. If your blood pressure stays high after you have made lifestyle changes or started on medicines,  Your medication(s) may need to be changed. Other problems may need to be addressed. Be certain you understand your prescriptions, and know how and when to take your medicine. Be sure to follow up with your caregiver within the time frame advised (usually within two weeks) to have your blood pressure rechecked and  to review your medications. If you are taking more than one medicine to lower your blood pressure, make sure you know how and at what times they should be taken. Taking two medicines at the same time can result in blood pressure that is too low. SEEK IMMEDIATE MEDICAL CARE IF: You develop a severe headache, blurred or changing vision, or confusion. You have unusual weakness or numbness, or a faint feeling. You have severe chest or abdominal pain, vomiting, or breathing problems. MAKE SURE YOU:  Understand these instructions. Will watch your condition. Will get help right away if you are not doing well or get worse. Document Released: 01/10/2005 Document Revised: 04/04/2011 Document Reviewed: 08/31/2007 [facility] Patient Information 2014 [place], [place].

## 2013-07-04 NOTE — ED Notes (Signed)
Patient transported to CT 

## 2013-07-04 NOTE — ED Provider Notes (Signed)
CSN: [id number]     Arrival date & time 07/04/13  1347 History   First MD Initiated Contact with Patient 07/04/13 1405     Chief Complaint  Patient presents with  . Fall     (Consider location/radiation/quality/duration/timing/severity/associated sxs/prior Treatment) HPI Comments: Patient with hx dementia. Seen at ED yesterday.  Lack on forehead from yesterday. New frontal L hematoma. Patient at basline mental status. Current DNR/DNI  Patient is a 80 y.o. male presenting with fall. The history is provided by medical records and the EMS personnel. No language interpreter was used.  Fall This is a recurrent problem. The current episode started today. Episode frequency: very frequent falls . Seen yesterday. The problem has been unchanged. Associated symptoms comments: Unable to review. Nonverbal, dementia.    Past Medical History  Diagnosis Date  . Atrial fibrillation   . Dementia   . Hypertension   . Shortness of breath   . Pneumonia 04/2013  . GERD (gastroesophageal reflux disease)    Past Surgical History  Procedure Laterality Date  . Cataract extraction     No family history on file. History  Substance Use Topics  . Smoking status: Former Smoker    Quit date: 06/13/1986  . Smokeless tobacco: Never Used  . Alcohol Use: No    Review of Systems  Unable to perform ROS: Dementia      Allergies  Penicillins  Home Medications   Prior to Admission medications   Medication Sig Start Date End Date Taking? Authorizing Provider  amLODipine (NORVASC) 5 MG tablet Take 5 mg by mouth daily.   Yes Historical Provider, MD  aspirin 81 MG tablet Take 81 mg by mouth daily.   Yes Historical Provider, MD  bisacodyl (DULCOLAX) 10 MG suppository Place 1 suppository (10 mg total) rectally daily as needed for moderate constipation. 05/10/13  Yes Joseph Art, DO  Ferrous Sulfate Dried (SLOW RELEASE IRON) 45 MG TBCR Take 45 mg by mouth daily.    Yes Historical Provider, MD  fish  oil-omega-3 fatty acids 1000 MG capsule Take 1 g by mouth daily.    Yes Historical Provider, MD  food thickener (THICK IT) POWD As needed 05/10/13  Yes Jessica U Vann, DO  lactulose (CHRONULAC) 10 GM/15ML solution Take 30 mLs (20 g total) by mouth 2 (two) times daily as needed for mild constipation. 05/10/13  Yes Joseph Art, DO  levofloxacin (LEVAQUIN) 750 MG tablet Take 1 tablet (750 mg total) by mouth daily. 06/19/13  Yes Glynn Octave, MD  Multiple Vitamin (MULTIVITAMIN WITH MINERALS) TABS tablet Take 1 tablet by mouth daily.   Yes Historical Provider, MD  polyethylene glycol (MIRALAX / GLYCOLAX) packet Take 17 g by mouth daily as needed for mild constipation or moderate constipation. 05/10/13  Yes Joseph Art, DO  sodium phosphate (FLEET) 7-19 GM/118ML ENEM Place 133 mLs (1 enema total) rectally daily as needed for severe constipation. 05/10/13  Yes Jessica U Vann, DO   BP 136/70  Pulse 79  Temp(Src) 97.7 F (36.5 C) (Temporal)  Resp 23  SpO2 100% Physical Exam  Nursing note and vitals reviewed. Constitutional:  Chronically ill appearing  HENT:  Head: Normocephalic.  Laceration Head and hematoma   Eyes: Conjunctivae are normal.  Neck: No JVD present.  Cardiovascular: Normal rate and regular rhythm.     ED Course  Procedures (including critical care time) Labs Review Labs Reviewed - No data to display  Imaging Review Dg Chest 1 View  07/03/2013  CLINICAL DATA:  Fall, head laceration, hypertension.  EXAM: CHEST - 1 VIEW  COMPARISON:  06/19/2013  FINDINGS: Colonic interposition over the liver. Low lung volumes. Cardiomegaly. No infiltrates or failure. No effusion or pneumothorax. No definite rib fracture. Bibasilar subsegmental atelectasis. Similar appearance to priors. Degenerative change both shoulders. Aortic arch atherosclerosis.  IMPRESSION: Stable chest.  No definite acute abnormality.   Electronically Signed   By: Davonna BellingJohn  Curnes M.D.   On: 07/03/2013 18:51   Ct Head Wo  Contrast  07/03/2013   CLINICAL DATA:  Severe headache. Multiple forehead abrasions secondary to a fall.  EXAM: CT HEAD WITHOUT CONTRAST  CT CERVICAL SPINE WITHOUT CONTRAST  TECHNIQUE: Multidetector CT imaging of the head and cervical spine was performed following the standard protocol without intravenous contrast. Multiplanar CT image reconstructions of the cervical spine were also generated.  COMPARISON:  CT scans dated 06/19/2013  FINDINGS: CT HEAD FINDINGS  No mass lesion. No midline shift. No acute hemorrhage or hematoma. No extra-axial fluid collections. No evidence of acute infarction. There is diffuse moderate cerebral cortical and cerebellar atrophy with secondary ventricular dilatation. There is chronic periventricular white matter lucency consistent with small vessel ischemic disease. There has been no change in the appearance of the brain since the prior study.  No acute osseous abnormality. Slight. Swelling over the frontal bone.  CT CERVICAL SPINE FINDINGS  There is no fracture or acute subluxation. No prevertebral soft tissue swelling. There is multilevel degenerative disc and joint disease, unchanged since the prior exam.  IMPRESSION: 1. No acute intracranial abnormality. Diffuse atrophy. Frontal scalp contusion. 2. No acute abnormality of the cervical spine. Multilevel degenerative disc and joint disease, stable.   Electronically Signed   By: Geanie CooleyJim  Maxwell M.D.   On: 07/03/2013 19:09   Ct Cervical Spine Wo Contrast  07/03/2013   CLINICAL DATA:  Severe headache. Multiple forehead abrasions secondary to a fall.  EXAM: CT HEAD WITHOUT CONTRAST  CT CERVICAL SPINE WITHOUT CONTRAST  TECHNIQUE: Multidetector CT imaging of the head and cervical spine was performed following the standard protocol without intravenous contrast. Multiplanar CT image reconstructions of the cervical spine were also generated.  COMPARISON:  CT scans dated 06/19/2013  FINDINGS: CT HEAD FINDINGS  No mass lesion. No midline shift.  No acute hemorrhage or hematoma. No extra-axial fluid collections. No evidence of acute infarction. There is diffuse moderate cerebral cortical and cerebellar atrophy with secondary ventricular dilatation. There is chronic periventricular white matter lucency consistent with small vessel ischemic disease. There has been no change in the appearance of the brain since the prior study.  No acute osseous abnormality. Slight. Swelling over the frontal bone.  CT CERVICAL SPINE FINDINGS  There is no fracture or acute subluxation. No prevertebral soft tissue swelling. There is multilevel degenerative disc and joint disease, unchanged since the prior exam.  IMPRESSION: 1. No acute intracranial abnormality. Diffuse atrophy. Frontal scalp contusion. 2. No acute abnormality of the cervical spine. Multilevel degenerative disc and joint disease, stable.   Electronically Signed   By: Geanie CooleyJim  Maxwell M.D.   On: 07/03/2013 19:09     EKG Interpretation None      MDM   Final diagnoses:  None   3:54 PM Patient with fall, head injury. CT pending. I have given report to PA palmer who will assume care of the patient.    Arthor CaptainAbigail Sivan Quast, PA-C 07/04/13 1836

## 2013-07-04 NOTE — ED Provider Notes (Signed)
Medical screening examination/treatment/procedure(s) were conducted as a shared visit with non-physician practitioner(s) and myself.  I personally evaluated the patient during the encounter.   EKG Interpretation None     Patient with fall. Laceration was closed. No severe injury from fall. Doubt severe infection. Will discharge home.  Juliet Rude. Rubin Payor, MD 07/04/13 [phone]

## 2013-07-04 NOTE — ED Provider Notes (Signed)
4:00 PM = received sign-out from [facility]. Await CT head and cervical spine. Patient at baseline.    Filed Vitals:   07/04/13 1500 07/04/13 1515 07/04/13 1800 07/04/13 1815  BP: 153/83 151/82 144/66 131/68  Pulse: 81 79 82 83  Temp:      TempSrc:      Resp: 16 14    SpO2: 100% 100% 96% 93%      CT Head Wo Contrast (Final result)  Result time: 07/04/13 16:19:14    Final result by Rad Results In Interface (07/04/13 16:19:14)    Narrative:   CLINICAL DATA: Fall. Head injury.  EXAM: CT HEAD WITHOUT CONTRAST  TECHNIQUE: Contiguous axial images were obtained from the base of the skull through the vertex without intravenous contrast.  COMPARISON: CT head 07/03/2013  FINDINGS: Generalized atrophy. Chronic microvascular ischemic change in the white matter. Negative for acute hemorrhage or mass.  Small left frontal scalp hematoma. Negative for skull fracture.  IMPRESSION: Atrophy and chronic microvascular ischemia. No acute intracranial abnormality. Left frontal scalp hematoma. No skull fracture.   Electronically Signed By: Marlan Palau M.D. On: 07/04/2013 16:19     CT Cervical Spine Wo Contrast (Final result)  Result time: 07/04/13 16:11:09    Final result by Rad Results In Interface (07/04/13 16:11:09)    Narrative:   CLINICAL DATA: Larey Seat. Hit head.  EXAM: CT CERVICAL SPINE WITHOUT CONTRAST  TECHNIQUE: Multidetector CT imaging of the cervical spine was performed without intravenous contrast. Multiplanar CT image reconstructions were also generated.  COMPARISON: 07/03/2013  FINDINGS: Examination is very limited due to the position of the patient and an motion artifact. There is advanced degenerative cervical spondylosis with multilevel disc disease and facet disease. The overall alignment is grossly normal. No obvious fracture or spinal canal compromise.  IMPRESSION: Very limited examination but no obvious fracture or  malalignment.   Electronically Signed By: Loralie Champagne M.D. On: 07/04/2013 16:11    Rechecks  5:00 PM = Patient resting comfortably. Awakens to verbal stimuli. Patient non-verbal. Sherron Monday with son who is in ED who states "I think he just fell asleep in his wheelchair and fall forward" and "he had a long night last night in the ED at [facility]." Large hematoma to the left frontal scalp. No new lacerations. Patient moving all extremities. Lungs clear to auscultation. No abdominal tenderness. Cervical spinal collar removed. No cervical spinal tenderness. ROM limited due to patient cooperation. Patient at baseline per son.      Patient seen at [facility] hospital yesterday for fall with head laceration. Laceration repaired yesterday. Tetanus up to date (08/04/11). Patient had an extensive evaluation yesterday (results reviewed) which included UA, blood cultures, CBC, CMP, troponin, blood gas, head and cervical spine CT, and chest x-ray. Patient stable and discharged back to NH where he had another fall. Head and cervical spine CT today showed no acute intracranial abnormality. Large frontal scalp hematoma with no new lacerations to repair. CT spine limited with no obvious fx or malalignment. Patient at baseline (hx of dementia). Patient is a DNR. Patient sent back to NH for continued care. Vital signs stable with mild hypertension (SBP 150's). Patient's son in ED and agrees with plan.    Discharge Medication List as of 07/04/2013  4:59 PM      Final impressions: 1. Fall at nursing home   2. Head injury   3. Hypertension       Luiz Iron PA-C         Shanda Bumps  Janalyn RouseK Daejon Lich, PA-C 07/05/13 0105

## 2013-07-04 NOTE — ED Notes (Signed)
[facility] HAS JUST BEEN CALLED FOR TRANSPORT

## 2013-07-04 NOTE — ED Notes (Addendum)
PER EMS: pt from [facility] nursing home, was found in floor today by staff member, golf ball size hematoma to left forehead. Was seen at [facility] yesterday for a fall and has gash to right side of head. Pt has dementia per EMS, eyes open but not able to follow commands and EMS reports this is his baseline mentation. Pt has not spoken but nursing home staff reports he usually mumbles and they can understand him. BP-122/81, HR-78, RR-16, O2-97% RA.

## 2013-07-04 NOTE — ED Notes (Signed)
Patient transported back from CT 

## 2013-07-04 NOTE — ED Notes (Signed)
PT MOVED TO ROOM AT NURSES STATION DUE TO HIS FALL HISTORY. HE IS AWAKE. HE HAS NOT MADE ANY ATTEMPT TO GET OOB.

## 2013-07-04 NOTE — ED Notes (Signed)
PT PERIPHERAL IV REMOVED FROM LEFT FOREARM SITE ASYMPTOMATIC.

## 2013-07-06 NOTE — ED Provider Notes (Signed)
Medical screening examination/treatment/procedure(s) were conducted as a shared visit with non-physician practitioner(s) and myself.  I personally evaluated the patient during the encounter.   EKG Interpretation None      Well appearing. Fall. Doubt PNA, suspect atelactasis. Mechanical fall. pcp follow up  Filed Vitals:   07/04/13 1815  BP: 131/68  Pulse: 83  Temp:   Resp:      1. Fall at nursing home   2. Head injury   3. Hypertension      Lyanne CoKevin M Justin Meisenheimer, MD 07/06/13 [phone]

## 2013-07-06 NOTE — ED Provider Notes (Signed)
Medical screening examination/treatment/procedure(s) were performed by non-physician practitioner and as supervising physician I was immediately available for consultation/collaboration.   EKG Interpretation None        Vanetta MuldersScott Sumiya Mamaril, MD 07/06/13 [phone]

## 2013-07-09 LAB — CULTURE, BLOOD (ROUTINE X 2)
Culture: NO GROWTH
Culture: NO GROWTH

## 2013-07-21 ENCOUNTER — Other Ambulatory Visit: Payer: Self-pay | Admitting: Family Medicine

## 2013-08-06 ENCOUNTER — Telehealth: Payer: Self-pay

## 2013-08-06 NOTE — Telephone Encounter (Signed)
[facility]

## 2013-08-24 DEATH — deceased

## 2014-05-28 IMAGING — CR DG PORTABLE PELVIS
1 series · 1 of 1 positions shown · non-contrast
Comparison: None.

CLINICAL DATA: Fall.

EXAM:
PORTABLE PELVIS 1-2 VIEWS

[AP]
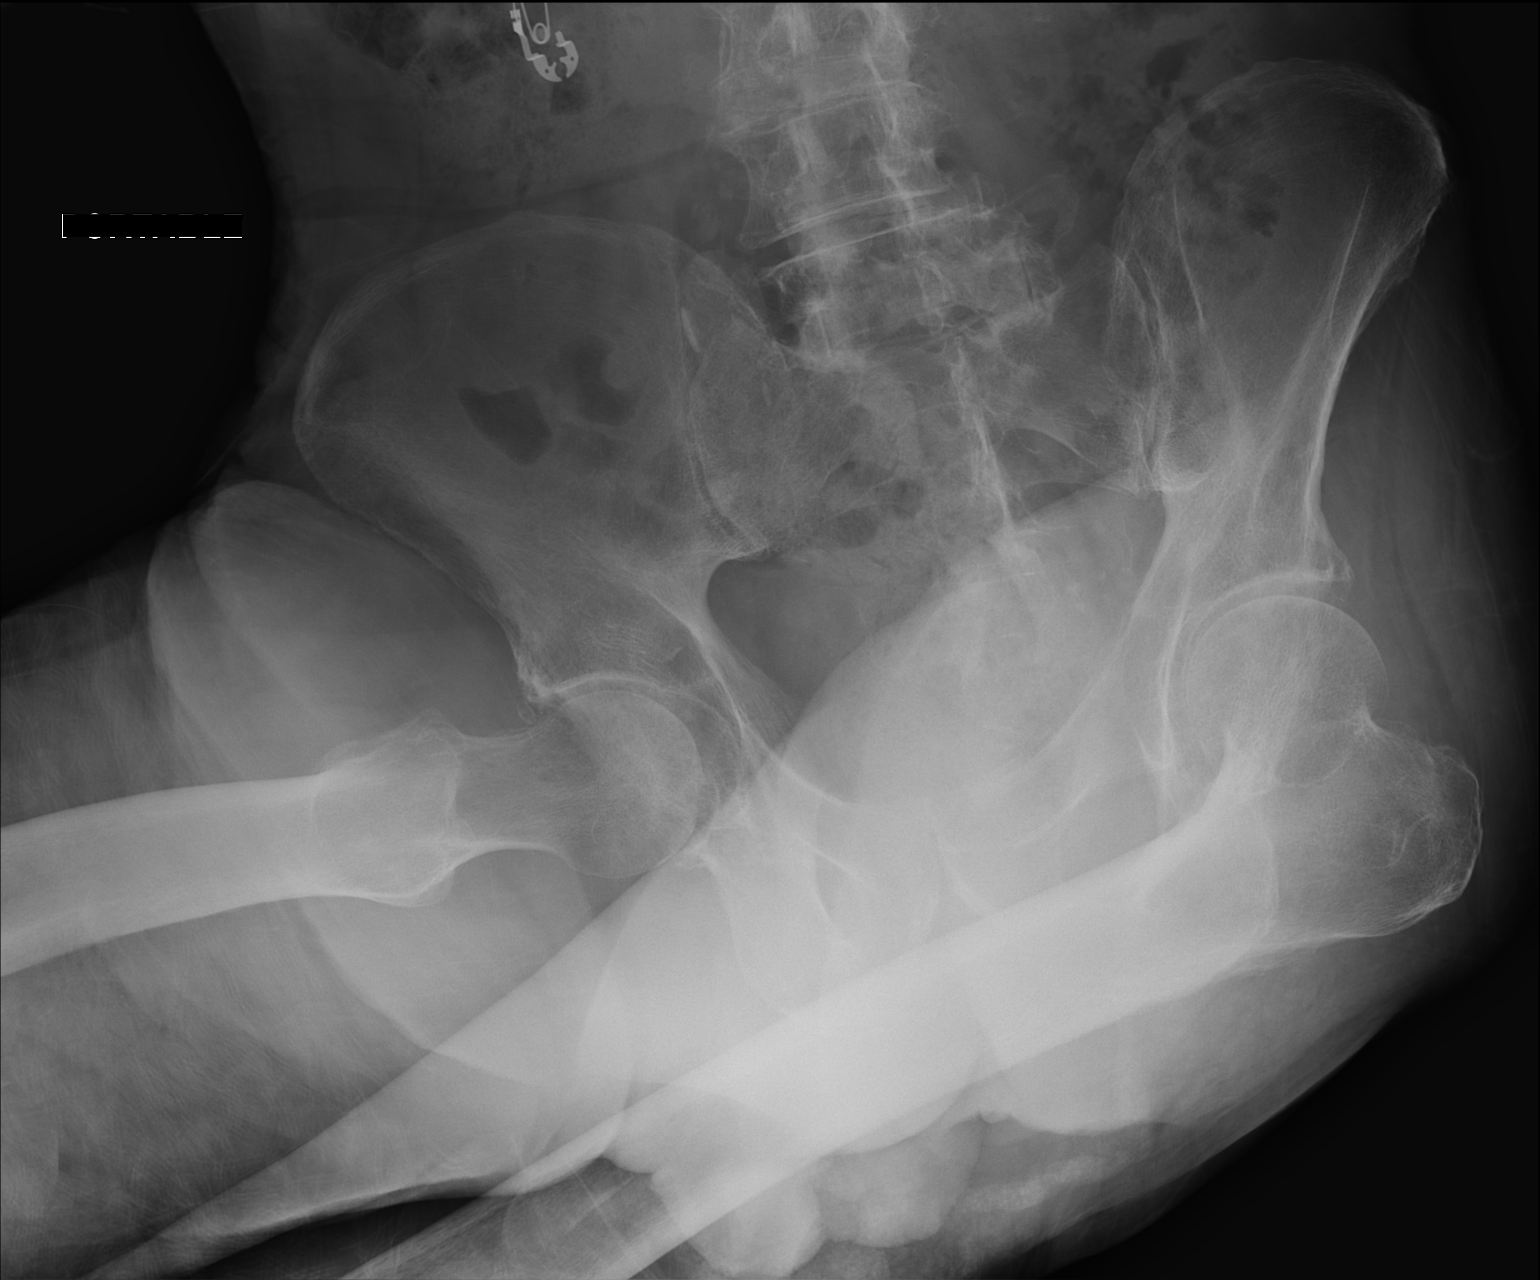

[1 of 1 positions shown; findings below may reference images not displayed]

FINDINGS: Mild joint space narrowing and spurring in the hip joints
bilaterally. SI joints are symmetric and unremarkable. No definite
fracture. Evaluation is somewhat limited by patient positioning.
IMPRESSION: No definite fracture.  Somewhat limited study by positioning.

## 2014-06-12 IMAGING — CT CT HEAD W/O CM
1 of 2 series · 16 of 30 positions shown, 20 images · non-contrast
Comparison: CT head 07/03/2013

CLINICAL DATA: Fall.  Head injury.

EXAM:
CT HEAD WITHOUT CONTRAST
TECHNIQUE: Contiguous axial images were obtained from the base of the skull
through the vertex without intravenous contrast.

[Series 3: head 5.0 h30s · axial · 0.54mm/px · z∈[-284,-114]mm · 16 of 38 slices shown, 20 images]
[im 2/38  brain]
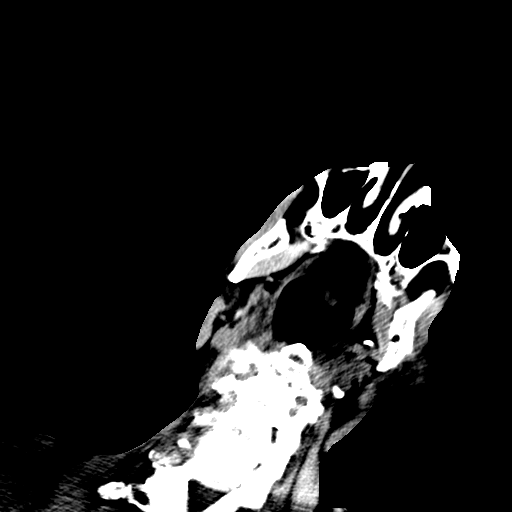
[im 2/38  bone]
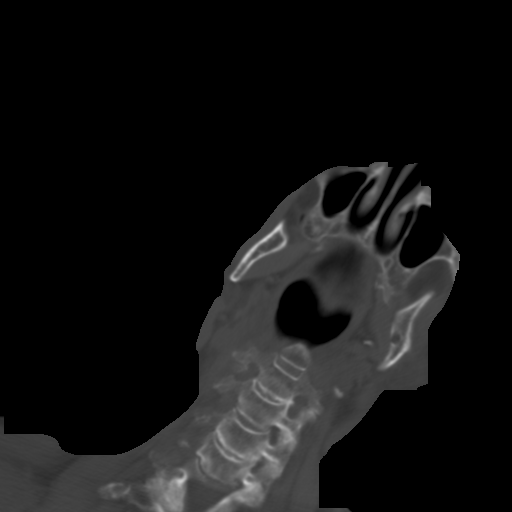
[im 4/38  brain]
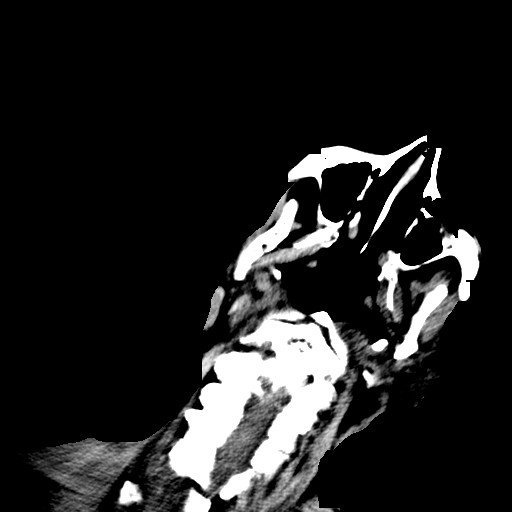
[im 6/38  brain]
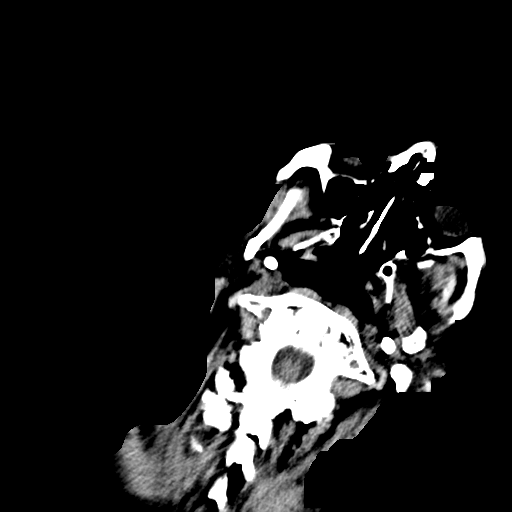
[im 10/38  brain]
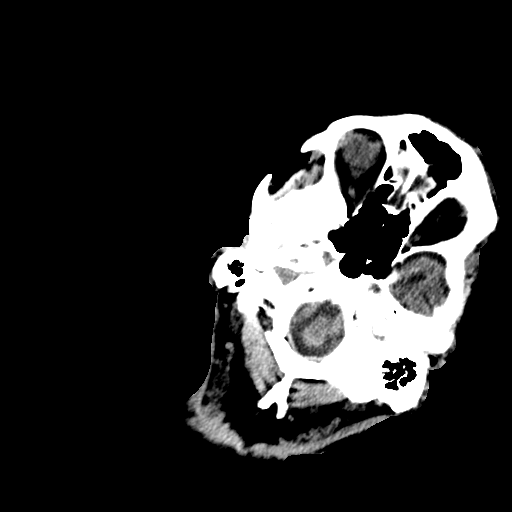
[im 12/38  brain]
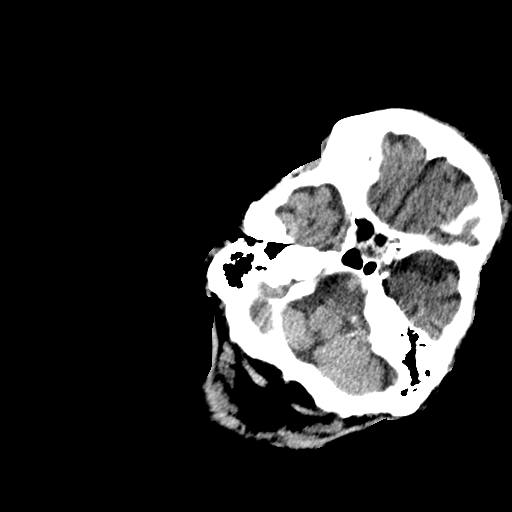
[im 12/38  bone]
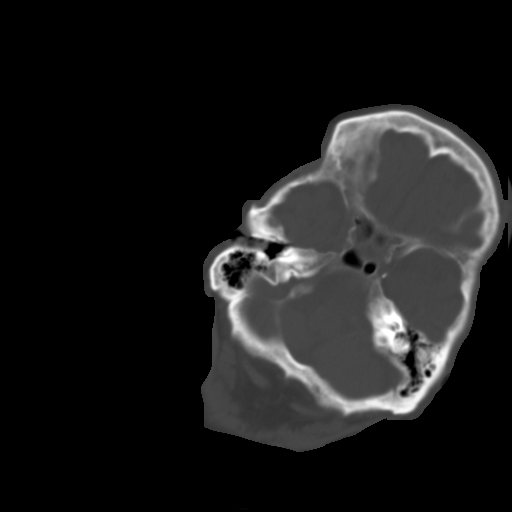
[im 13/38  brain]
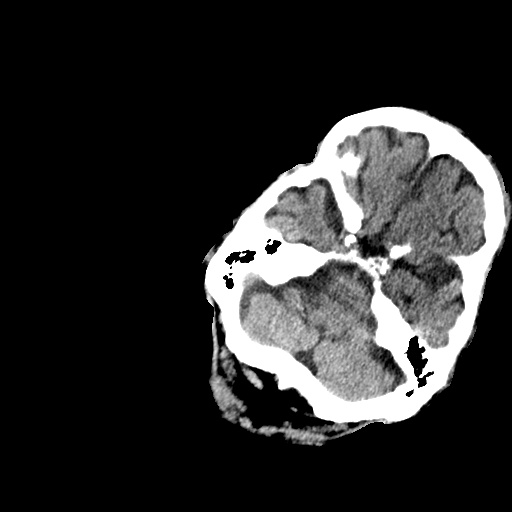
[im 15/38  brain]
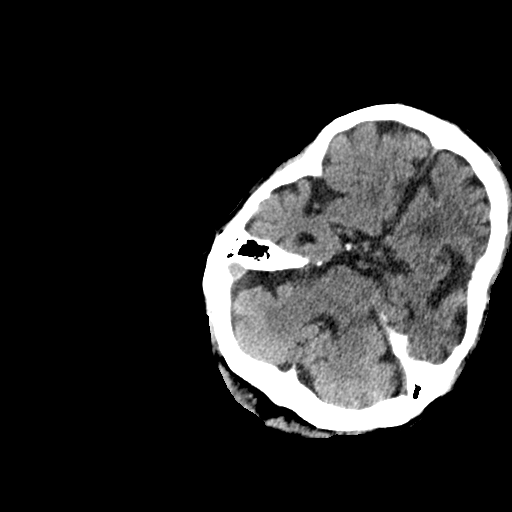
[im 17/38  brain]
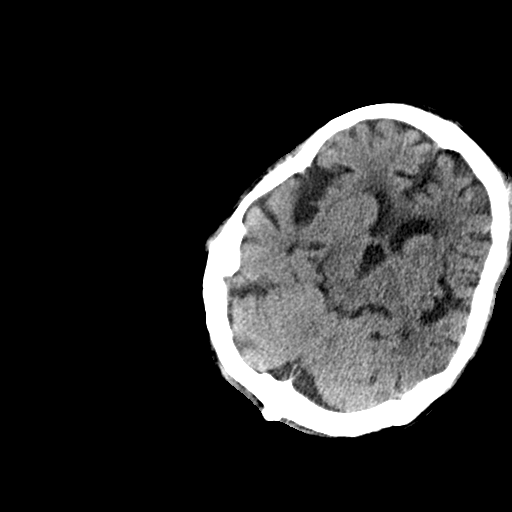
[im 21/38  brain]
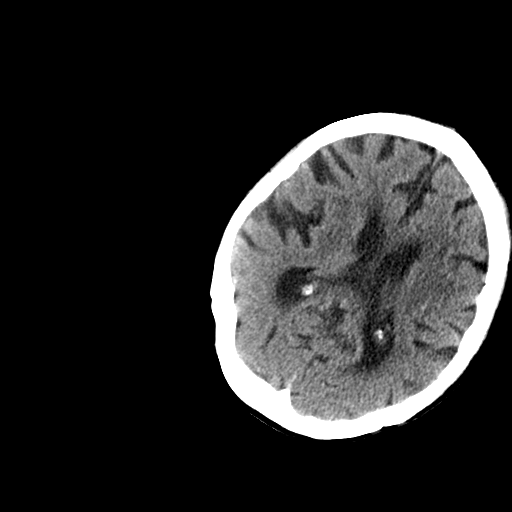
[im 21/38  bone]
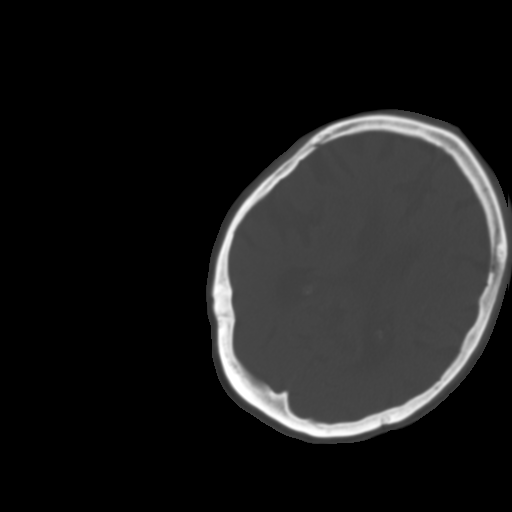
[im 23/38  brain]
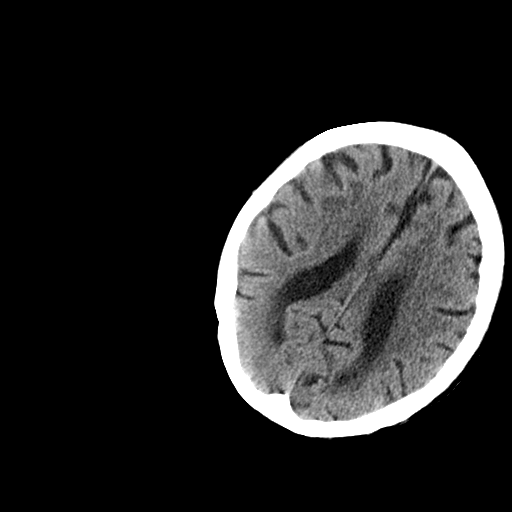
[im 25/38  brain]
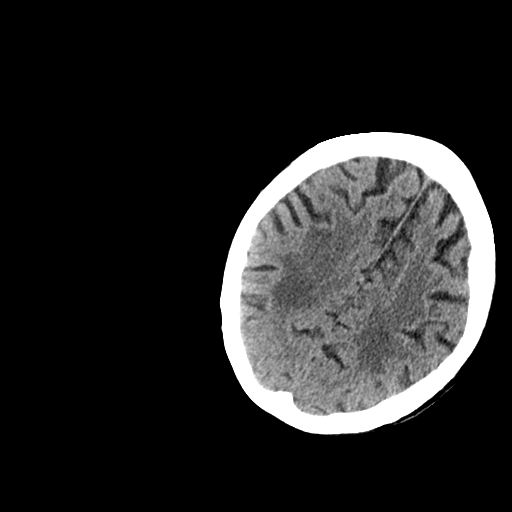
[im 26/38  brain]
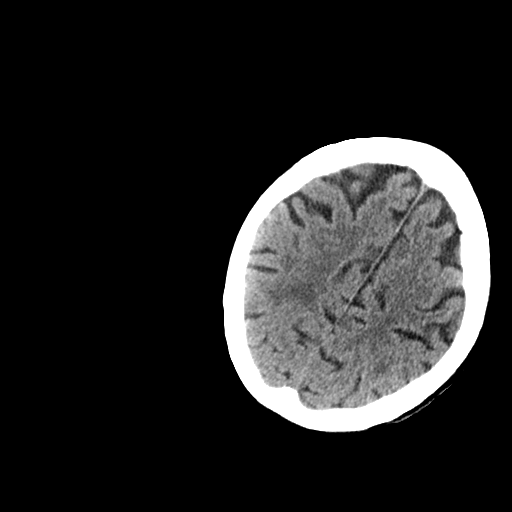
[im 28/38  brain]
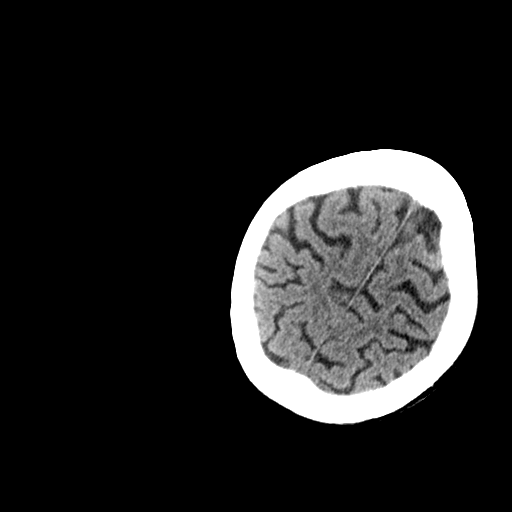
[im 28/38  bone]
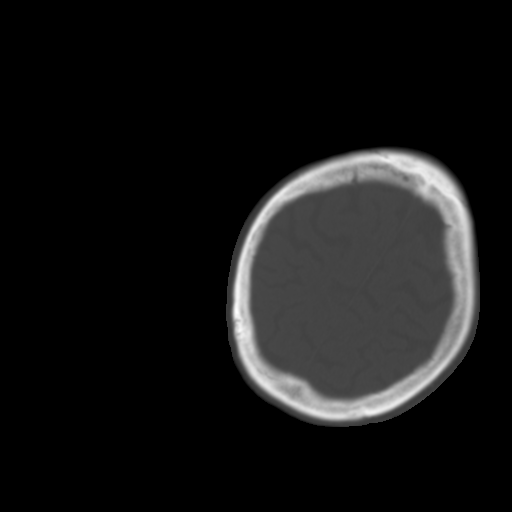
[im 32/38  brain]
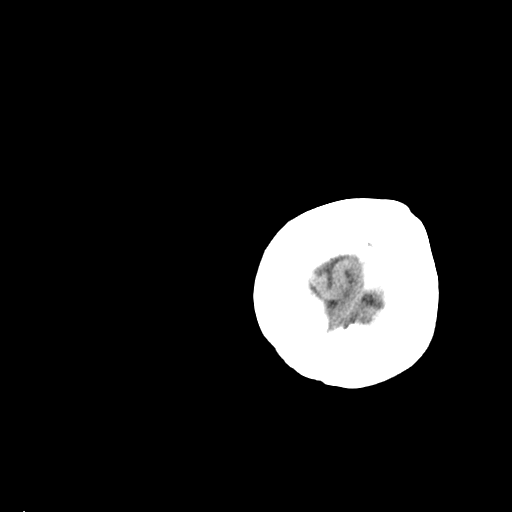
[im 34/38  brain]
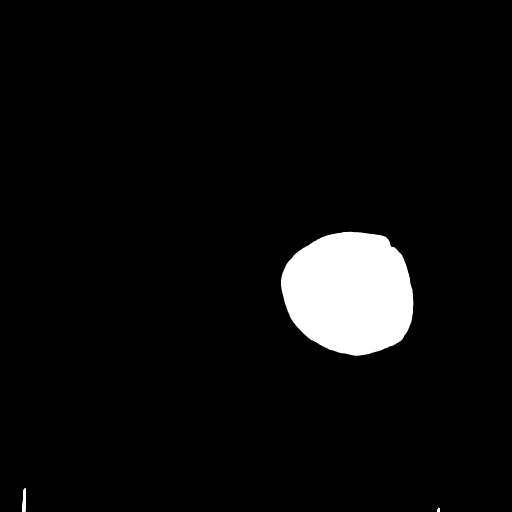
[im 36/38  brain]
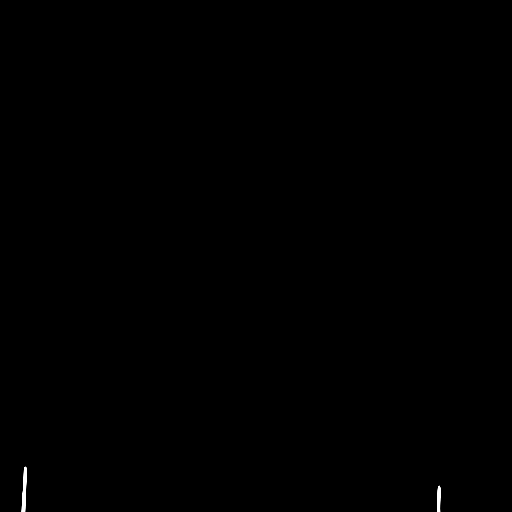

[16 of 30 positions shown; findings below may reference images not displayed]

FINDINGS: Generalized atrophy. Chronic microvascular ischemic change in the
white matter. Negative for acute hemorrhage or mass.

Small left frontal scalp hematoma.  Negative for skull fracture.
IMPRESSION: Atrophy and chronic microvascular ischemia. No acute intracranial
abnormality. Left frontal scalp hematoma. No skull fracture.
